# Patient Record
Sex: Female | Born: 1955
Health system: Southern US, Community
[De-identification: ages and names within clinical notes are randomized; demographics above are authoritative.]

## PROBLEM LIST (undated history)

## (undated) DIAGNOSIS — E785 Hyperlipidemia, unspecified: Secondary | ICD-10-CM

## (undated) DIAGNOSIS — K219 Gastro-esophageal reflux disease without esophagitis: Secondary | ICD-10-CM

## (undated) DIAGNOSIS — D869 Sarcoidosis, unspecified: Secondary | ICD-10-CM

## (undated) DIAGNOSIS — E039 Hypothyroidism, unspecified: Secondary | ICD-10-CM

## (undated) DIAGNOSIS — J069 Acute upper respiratory infection, unspecified: Secondary | ICD-10-CM

## (undated) DIAGNOSIS — D860 Sarcoidosis of lung: Secondary | ICD-10-CM

## (undated) DIAGNOSIS — I10 Essential (primary) hypertension: Secondary | ICD-10-CM

## (undated) HISTORY — DX: Acute upper respiratory infection, unspecified: J06.9

## (undated) HISTORY — PX: OTHER SURGICAL HISTORY: SHX169

## (undated) HISTORY — DX: Sarcoidosis of lung: D86.0

## (undated) HISTORY — DX: Hyperlipidemia, unspecified: E78.5

## (undated) HISTORY — DX: Sarcoidosis, unspecified: D86.9

## (undated) HISTORY — DX: Gastro-esophageal reflux disease without esophagitis: K21.9

## (undated) HISTORY — DX: Essential (primary) hypertension: I10

## (undated) HISTORY — PX: ABDOMINAL HYSTERECTOMY: SHX81

## (undated) HISTORY — PX: BUNIONECTOMY: SHX129

---

## 1998-12-20 ENCOUNTER — Other Ambulatory Visit: Admission: RE | Admit: 1998-12-20 | Discharge: 1998-12-20 | Payer: Self-pay | Admitting: Obstetrics and Gynecology

## 1999-12-18 ENCOUNTER — Encounter: Payer: Self-pay | Admitting: Emergency Medicine

## 1999-12-18 ENCOUNTER — Emergency Department (HOSPITAL_COMMUNITY): Admission: EM | Admit: 1999-12-18 | Discharge: 1999-12-18 | Payer: Self-pay | Admitting: Emergency Medicine

## 2000-04-08 ENCOUNTER — Other Ambulatory Visit: Admission: RE | Admit: 2000-04-08 | Discharge: 2000-04-08 | Payer: Self-pay | Admitting: Obstetrics and Gynecology

## 2000-08-26 ENCOUNTER — Encounter: Admission: RE | Admit: 2000-08-26 | Discharge: 2000-08-26 | Payer: Self-pay | Admitting: Family Medicine

## 2000-08-26 ENCOUNTER — Encounter: Payer: Self-pay | Admitting: Family Medicine

## 2001-04-03 ENCOUNTER — Other Ambulatory Visit: Admission: RE | Admit: 2001-04-03 | Discharge: 2001-04-03 | Payer: Self-pay | Admitting: Obstetrics and Gynecology

## 2001-08-27 ENCOUNTER — Encounter: Admission: RE | Admit: 2001-08-27 | Discharge: 2001-08-27 | Payer: Self-pay | Admitting: Family Medicine

## 2001-08-27 ENCOUNTER — Encounter: Payer: Self-pay | Admitting: Family Medicine

## 2001-12-31 ENCOUNTER — Encounter: Payer: Self-pay | Admitting: Family Medicine

## 2001-12-31 ENCOUNTER — Encounter: Admission: RE | Admit: 2001-12-31 | Discharge: 2001-12-31 | Payer: Self-pay | Admitting: Family Medicine

## 2002-05-24 ENCOUNTER — Other Ambulatory Visit: Admission: RE | Admit: 2002-05-24 | Discharge: 2002-05-24 | Payer: Self-pay | Admitting: Obstetrics and Gynecology

## 2002-08-30 ENCOUNTER — Encounter: Payer: Self-pay | Admitting: Obstetrics and Gynecology

## 2002-08-30 ENCOUNTER — Encounter: Admission: RE | Admit: 2002-08-30 | Discharge: 2002-08-30 | Payer: Self-pay | Admitting: Obstetrics and Gynecology

## 2003-07-30 ENCOUNTER — Encounter: Payer: Self-pay | Admitting: Unknown Physician Specialty

## 2003-07-30 ENCOUNTER — Ambulatory Visit (HOSPITAL_COMMUNITY): Admission: RE | Admit: 2003-07-30 | Discharge: 2003-07-30 | Payer: Self-pay | Admitting: Unknown Physician Specialty

## 2003-08-10 ENCOUNTER — Other Ambulatory Visit: Admission: RE | Admit: 2003-08-10 | Discharge: 2003-08-10 | Payer: Self-pay | Admitting: Obstetrics and Gynecology

## 2003-08-15 ENCOUNTER — Encounter: Payer: Self-pay | Admitting: Thoracic Surgery

## 2003-08-17 ENCOUNTER — Ambulatory Visit (HOSPITAL_COMMUNITY): Admission: RE | Admit: 2003-08-17 | Discharge: 2003-08-17 | Payer: Self-pay | Admitting: Thoracic Surgery

## 2003-08-17 ENCOUNTER — Encounter (INDEPENDENT_AMBULATORY_CARE_PROVIDER_SITE_OTHER): Payer: Self-pay | Admitting: Specialist

## 2003-11-11 ENCOUNTER — Encounter (INDEPENDENT_AMBULATORY_CARE_PROVIDER_SITE_OTHER): Payer: Self-pay | Admitting: *Deleted

## 2003-11-11 ENCOUNTER — Ambulatory Visit (HOSPITAL_COMMUNITY): Admission: RE | Admit: 2003-11-11 | Discharge: 2003-11-11 | Payer: Self-pay | Admitting: *Deleted

## 2003-11-21 ENCOUNTER — Ambulatory Visit (HOSPITAL_COMMUNITY): Admission: RE | Admit: 2003-11-21 | Discharge: 2003-11-21 | Payer: Self-pay

## 2003-12-02 ENCOUNTER — Encounter: Admission: RE | Admit: 2003-12-02 | Discharge: 2003-12-02 | Payer: Self-pay | Admitting: Family Medicine

## 2004-10-09 ENCOUNTER — Other Ambulatory Visit: Admission: RE | Admit: 2004-10-09 | Discharge: 2004-10-09 | Payer: Self-pay | Admitting: Obstetrics and Gynecology

## 2005-02-11 ENCOUNTER — Encounter: Admission: RE | Admit: 2005-02-11 | Discharge: 2005-02-11 | Payer: Self-pay | Admitting: Family Medicine

## 2005-04-09 ENCOUNTER — Ambulatory Visit: Payer: Self-pay | Admitting: Internal Medicine

## 2005-12-20 ENCOUNTER — Other Ambulatory Visit: Admission: RE | Admit: 2005-12-20 | Discharge: 2005-12-20 | Payer: Self-pay | Admitting: Obstetrics and Gynecology

## 2005-12-23 HISTORY — PX: COLONOSCOPY: SHX174

## 2006-09-25 ENCOUNTER — Ambulatory Visit (HOSPITAL_BASED_OUTPATIENT_CLINIC_OR_DEPARTMENT_OTHER): Admission: RE | Admit: 2006-09-25 | Discharge: 2006-09-25 | Payer: Self-pay | Admitting: Urology

## 2007-04-17 ENCOUNTER — Ambulatory Visit: Payer: Self-pay | Admitting: Oncology

## 2007-05-12 LAB — CBC WITH DIFFERENTIAL/PLATELET
Basophils Absolute: 0 10*3/uL (ref 0.0–0.1)
Eosinophils Absolute: 0.3 10*3/uL (ref 0.0–0.5)
HGB: 12.5 g/dL (ref 11.6–15.9)
LYMPH%: 20.7 % (ref 14.0–48.0)
MCV: 85.1 fL (ref 81.0–101.0)
MONO%: 9 % (ref 0.0–13.0)
NEUT#: 5.6 10*3/uL (ref 1.5–6.5)
Platelets: 348 10*3/uL (ref 145–400)

## 2007-05-14 LAB — BETA 2 MICROGLOBULIN, SERUM: Beta-2 Microglobulin: 1.38 mg/L (ref 1.01–1.73)

## 2007-05-14 LAB — COMPREHENSIVE METABOLIC PANEL
Albumin: 4.6 g/dL (ref 3.5–5.2)
Alkaline Phosphatase: 73 U/L (ref 39–117)
BUN: 11 mg/dL (ref 6–23)
Creatinine, Ser: 0.87 mg/dL (ref 0.40–1.20)
Glucose, Bld: 102 mg/dL — ABNORMAL HIGH (ref 70–99)
Total Bilirubin: 0.3 mg/dL (ref 0.3–1.2)

## 2007-05-14 LAB — IMMUNOFIXATION ELECTROPHORESIS
IgA: 196 mg/dL (ref 68–378)
IgM, Serum: 130 mg/dL (ref 60–263)
Total Protein, Serum Electrophoresis: 8.9 g/dL — ABNORMAL HIGH (ref 6.0–8.3)

## 2007-05-14 LAB — SEDIMENTATION RATE: Sed Rate: 34 mm/hr — ABNORMAL HIGH (ref 0–22)

## 2007-06-23 ENCOUNTER — Ambulatory Visit: Payer: Self-pay | Admitting: Internal Medicine

## 2008-06-21 ENCOUNTER — Ambulatory Visit: Payer: Self-pay | Admitting: Internal Medicine

## 2008-06-21 ENCOUNTER — Telehealth (INDEPENDENT_AMBULATORY_CARE_PROVIDER_SITE_OTHER): Payer: Self-pay | Admitting: *Deleted

## 2008-06-21 DIAGNOSIS — D869 Sarcoidosis, unspecified: Secondary | ICD-10-CM | POA: Insufficient documentation

## 2008-06-21 DIAGNOSIS — J069 Acute upper respiratory infection, unspecified: Secondary | ICD-10-CM | POA: Insufficient documentation

## 2008-06-28 DIAGNOSIS — K219 Gastro-esophageal reflux disease without esophagitis: Secondary | ICD-10-CM | POA: Insufficient documentation

## 2008-08-31 ENCOUNTER — Telehealth: Payer: Self-pay | Admitting: Internal Medicine

## 2008-09-01 ENCOUNTER — Ambulatory Visit: Payer: Self-pay | Admitting: Internal Medicine

## 2008-12-21 ENCOUNTER — Telehealth: Payer: Self-pay | Admitting: Internal Medicine

## 2008-12-30 ENCOUNTER — Encounter: Payer: Self-pay | Admitting: Internal Medicine

## 2008-12-30 ENCOUNTER — Telehealth: Payer: Self-pay | Admitting: Internal Medicine

## 2009-07-21 ENCOUNTER — Ambulatory Visit: Payer: Self-pay | Admitting: Internal Medicine

## 2009-07-21 DIAGNOSIS — I10 Essential (primary) hypertension: Secondary | ICD-10-CM | POA: Insufficient documentation

## 2009-08-04 ENCOUNTER — Telehealth: Payer: Self-pay | Admitting: Adult Health

## 2009-08-25 ENCOUNTER — Encounter: Admission: RE | Admit: 2009-08-25 | Discharge: 2009-08-25 | Payer: Self-pay | Admitting: Family Medicine

## 2009-09-05 ENCOUNTER — Encounter: Admission: RE | Admit: 2009-09-05 | Discharge: 2009-09-05 | Payer: Self-pay | Admitting: Family Medicine

## 2009-09-11 ENCOUNTER — Telehealth: Payer: Self-pay | Admitting: Internal Medicine

## 2009-09-20 ENCOUNTER — Ambulatory Visit: Payer: Self-pay | Admitting: Internal Medicine

## 2009-09-25 ENCOUNTER — Telehealth: Payer: Self-pay | Admitting: Internal Medicine

## 2010-05-08 ENCOUNTER — Telehealth: Payer: Self-pay | Admitting: Internal Medicine

## 2010-07-20 ENCOUNTER — Ambulatory Visit: Payer: Self-pay | Admitting: Internal Medicine

## 2010-08-07 ENCOUNTER — Ambulatory Visit: Payer: Self-pay | Admitting: Internal Medicine

## 2010-08-07 DIAGNOSIS — R0602 Shortness of breath: Secondary | ICD-10-CM | POA: Insufficient documentation

## 2010-08-07 LAB — PULMONARY FUNCTION TEST

## 2010-09-18 ENCOUNTER — Ambulatory Visit: Payer: Self-pay | Admitting: Internal Medicine

## 2010-09-18 DIAGNOSIS — E785 Hyperlipidemia, unspecified: Secondary | ICD-10-CM | POA: Insufficient documentation

## 2011-01-24 NOTE — Assessment & Plan Note (Signed)
Summary: F/U 2 MONTHS///KP   Primary Provider/Referring Provider:  Dr Cam Hai  CC:  2 month follow up visit-breathing is better since humidity is down.Marland Kitchen  History of Present Illness:  09/20/09- hx sarcoid, GERD CT showed marked lymhadenopathy, c/w her sarcoid. We brought her back to discuss that, The last ACE was 06/23/07- 28 (9-67). She feels fine.She notes occasional mild indgestion after eating. Joints sometimes minor stiff/ aching. Not coughing. CT reviewed by me and discussed with her- prominent cevical adenopathy c/w sarcoid.  July 20, 2010- Hx Sarcoid, GERD CXR 09/20/09- stable mild hilar adenopathy ACE level 09/20/09- 22 (9-67) Feels more breathless/ labored in current code orange air quality. Denies night sweats, fever, swollen glands or rash. Not coughing. She has more trouble dealing with hot weather and recognizes that affects her on her drive home, but she is not really descirbing dypnea otherwise.  September 18, 2010- Hx Sarcoid, GERD Feels well w/o night sweat, cough, chest pain, change in nodes or rash. Likes decreased humidity. CXR last year had shown persistent stable hilar nodes, NAD. PFT- 08/07/10 Mild restriction and obstruction. FEV1 1.58/ 68%; FEV1/FVC 0.79; DLCO 64; TLC 75% 100, 100, 100%, 463 m.  Preventive Screening-Counseling & Management  Alcohol-Tobacco     Smoking Status: never  Current Medications (verified): 1)  Levoxyl 50 Mcg  Tabs (Levothyroxine Sodium) .... Take 1 By Mouth Once Daily 2)  Premarin 1.25 Mg  Patch .... Twice Per Wk 3)  Crestor 40 Mg Tabs (Rosuvastatin Calcium) .... Take 1 By Mouth Once Daily 4)  Hydrochlorothiazide 12.5 Mg  Tabs (Hydrochlorothiazide) .... Take 1/2 By Mouth Once Daily 5)  Claritin 10 Mg Tabs (Loratadine) .... Take One Tablet Every Morning 6)  Garlic 2000 Mg Caps (Garlic) .... Take 1 By Mouth Once Daily 7)  Multivitamins  Tabs (Multiple Vitamin) .... Take 1 By Mouth Once Daily  Allergies (verified): 1)  !  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 Never smoked   Risk Factors: Smoking Status: never (09/18/2010)  Past Medical History: GERD (ICD-530.81) VIRAL URI (ICD-465.9) SARCOIDOSIS, PULMONARY (ICD-135)-                                           - Dx'd mediastinal node bx                                          - PFT 08/09/10- FEV1 1.58/ 68%; FEV1?FVC 0.79, TLC 75%, DLCO 0.64 Hypertension Hyperlipidemia  Review of Systems      See HPI  The patient denies shortness of breath with activity, shortness of breath at rest, productive cough, non-productive cough, coughing up blood, chest pain, irregular heartbeats, acid heartburn, indigestion, loss of appetite, weight change, abdominal pain, difficulty swallowing, sore throat, tooth/dental problems, headaches, nasal congestion/difficulty breathing through nose, sneezing, itching, ear ache, anxiety, rash, change in color of mucus, and fever.    Vital Signs:  Patient profile:   55 year old female Height:      62.5 inches Weight:  136.50 pounds BMI:     24.66 O2 Sat:      100 % on Room air Pulse rate:   94 / minute BP sitting:   124 / 66  (left arm) Cuff size:   regular  Vitals Entered By: Reynaldo Minium CMA (September 18, 2010 2:37 PM)  O2 Flow:  Room air CC: 2 month follow up visit-breathing is better since humidity is down.   Physical Exam  Additional Exam:  General: A/Ox3; pleasant and cooperative, NAD, wdwn. SKIN: hyperpigmented scar on sternum without nodularity- unchanged NODES: supraclavicular nodes nontender, rubbery HEENT: [place]/AT, EOM- WNL, Conjuctivae- clear, PERRLA, TM-WNL, Nose- clear, Throat- clear and wnl,  Mallampati  III NECK:  Fullness- probably enlarged cervical nodes Supple w/ fair ROM, JVD- none, normal carotid impulses w/o bruits Thyroid-  CHEST: Clear to P&A HEART: RRR, no m/g/r heard ABDOMEN: Soft and nl; ZOX:WRUE, nl pulses, no edema  NEURO: Grossly intact to observation      Impression & Recommendations:  Problem # 1:  SARCOIDOSIS, PULMONARY (ICD-135) Stable and probably burned out with residual adenopathy.  extensive lab review done. Her PFT changes are consistent with some mild interstitial changes from sarcoid, with no therapy needed. She does active cardio Zumba classes three times weekly and that is great. Her oxygenation is good.  We discussed pneumovax and I am comfortable to let it wait till age 36. Wants Flu vax  Medications Added to Medication List This Visit: 1)  Crestor 40 Mg Tabs (Rosuvastatin calcium) .... Take 1 by mouth once daily  Other Orders: Est. Patient Level IV (45409) Admin 1st Vaccine (81191) Flu Vaccine 105yrs + (47829)  Patient Instructions: 1)  cc Dr Cam Hai 2)  Please schedule a follow-up appointment as needed. 3)  Flu vax    Flu Vaccine Consent Questions     Do you have a history of severe allergic reactions to this vaccine? no    Any prior history of allergic reactions to egg and/or gelatin? no    Do you have a sensitivity to the preservative Thimersol? no    Do you have a past history of Guillan-Barre Syndrome? no    Do you currently have an acute febrile illness? no    Have you ever had a severe reaction to latex? no    Vaccine information given and explained to patient? yes    Are you currently pregnant? no    Lot Number:[id number]   Exp Date:06/22/2011   Site Given  Right Deltoid IMlu Abigail Miyamoto RN  September 18, 2010 3:28 PM

## 2011-01-24 NOTE — Assessment & Plan Note (Signed)
Summary: f/u 1 yr ///kp   Primary Provider/Referring Provider:  Dr Cam Hai  CC:  Yearly follow up visit-sarcoidosis.Marland Kitchen  History of Present Illness: History of Present Illness: 09/01/08- 55 year old woman with history of sarcoid and GERD.    Chest x-ray in July -2008  showed hilar adenopathy with normal ACE level on lab work.    September 01, 2008--Complains of 1 week of congestion, thick mucus, nasal drip, sinus pressure, chills. Denies chest pain, dyspnea, orthopnea, hemoptysis, fever, n/v/d, edema, recent travel, purulent sputum.   07/20/09- hx Sarcoid, GERD Feels well with no problems since last here for an acute visit last September. She denies cough, night sweats, fevers, glands, rash, chest pain.  09/20/09- hx sarcoid, GERD CT showed marked lymhadenopathy, c/w her sarcoid. We brought her back to discuss that, The last ACE was 06/23/07- 28 (9-67). She feels fine.She notes occasional mild indgestion after eating. Joints sometimes minor stiff/ aching. Not coughing. CT reviewed by me and discussed with her- prominent cevical adenopathy c/w sarcoid.  July 20, 2010- Hx Sarcoid, GERD CXR 09/20/09- stable mild hilar adenopathy ACE level 09/20/09- 22 (9-67) Feels more breathless/ labored in current code orange air quality. Denies night sweats, fever, swollen glands or rash. Not coughing. She has more trouble dealing with hot weather and recognizes that affects her on her drive home, but she is not really descirbing dypnea otherwise.    Preventive Screening-Counseling & Management  Alcohol-Tobacco     Smoking Status: never  Current Medications (verified): 1)  Levoxyl 50 Mcg  Tabs (Levothyroxine Sodium) .... Take 1 By Mouth Once Daily 2)  Premarin 1.25 Mg  Patch .... Twice Per Wk 3)  Crestor 10 Mg  Tabs (Rosuvastatin Calcium) .... Take 1 By Mouth Once Daily 4)  Hydrochlorothiazide 12.5 Mg  Tabs (Hydrochlorothiazide) .... Take 1/2 By Mouth Once Daily 5)  Claritin 10 Mg Tabs (Loratadine)  .... Take One Tablet Every Morning 6)  Nasacort Aq 55 Mcg/act Aers (Triamcinolone Acetonide(Nasal)) .... 2 Puffs Two Times A Day As Needed 7)  Garlic 2000 Mg Caps (Garlic) .... Take 1 By Mouth Once Daily 8)  Multivitamins  Tabs (Multiple Vitamin) .... Take 1 By Mouth Once Daily  Allergies (verified): 1)  ! 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  The patient denies anorexia, fever, weight loss, vision loss, decreased hearing, hoarseness, chest pain, syncope, dyspnea on exertion, peripheral edema, prolonged cough, headaches, hemoptysis, abdominal pain, and severe indigestion/heartburn.    Vital Signs:  Patient profile:   55 year old female Height:      62.5 inches Weight:      139 pounds BMI:     25.11 O2 Sat:      100 % on Room air Pulse rate:   72 / minute BP sitting:   140 / 76  (left arm) Cuff size:   regular  Vitals Entered By: Reynaldo Minium CMA (July 20, 2010  2:29 PM)  O2 Flow:  Room air CC: Yearly follow up visit-sarcoidosis.   Physical Exam  Additional Exam:  General: A/Ox3; pleasant and cooperative, NAD, wdwn. has gained some weight SKIN:hyperpigmented scar on sternum without nodularity- unchanged NODES: no lymphadenopathy HEENT: [place]/AT, EOM- WNL, Conjuctivae- clear, PERRLA, TM-WNL, Nose- clear, Throat- clear and wnl NECK:  Fullness- probably  enlarged cervical nodes Supple w/ fair ROM, JVD- none, normal carotid impulses w/o bruits Thyroid-  CHEST: Clear to P&A HEART: RRR, no m/g/r heard ABDOMEN: Soft and nl; EAV:WUJW, nl pulses, no edema  NEURO: Grossly intact to observation      Impression & Recommendations:  Problem # 1:  SARCOIDOSIS, PULMONARY (ICD-135) I find no sign of active sarcoid. She needs to be carefull in hot weather and pace herself accordingly. We discussed whether menopausal hot flashing was aggravating sensitivity to hot weather, but I suspect her weight gain is more significant. We will schedule a PFT.  Problem # 2:  GERD (ICD-530.81) She isn't aware that she has been refluxing lately. We re=viewed reflux precautions. The following medications were removed from the medication list:    Prilosec Otc 20 Mg Tbec (Omeprazole magnesium) .Marland Kitchen... 1 by mouth once daily  Medications Added to Medication List This Visit: 1)  Garlic 2000 Mg Caps (Garlic) .... Take 1 by mouth once daily 2)  Multivitamins Tabs (Multiple vitamin) .... Take 1 by mouth once daily  Other Orders: Est. Patient Level IV (11914)  Patient Instructions: 1)  Please schedule a follow-up appointment in 2 months 2)  Schedule PFT with 6 MWT

## 2011-01-24 NOTE — Progress Notes (Signed)
Summary: rx req/ sinus  Phone Note Call from Patient   Caller: Patient Call For: young Summary of Call: pt c/o sinus congestion "off and on for a month". has used nasal spray/ netty pot/ claritin D as well as musinex D. has a "skunk-like" taste in her mouth. denies fever.  says she's able to work fine, but [facility] [address] [phone] or home # if calling after 5pm. note: since she hasn't been seen in a while i did offer an appt w/ tp for tomorrow but pt prefers not to have to come in for now.  Initial call taken by: Tivis Ringer, CNA,  May 08, 2010 11:09 AM  Follow-up for Phone Call        [facility]. Carron Curie CMA  May 08, 2010 11:31 AM  Spoke with pt.  Pt states she has a dry cough and sinus congestion "on and off" x1 month.  Denies fever.  Has tried all the above meds.  [facility]ing [facility] [address] [facility].  Will forward to CY-pls advise.  Thanks!   Additional Follow-up for Phone Call Additional follow up Details #1::        Try Septra DS, # 14, 1 two times a day  Additional Follow-up by: Waymon Budge MD,  May 08, 2010 12:22 PM    Additional Follow-up for Phone Call Additional follow up Details #2::    rx sent. pt aware. Carron Curie CMA  May 08, 2010 1:16 PM   New/Updated Medications: SEPTRA DS 800-160 MG TABS (SULFAMETHOXAZOLE-TRIMETHOPRIM) Take 1 tablet by mouth two times a day Prescriptions: SEPTRA DS 800-160 MG TABS (SULFAMETHOXAZOLE-TRIMETHOPRIM) Take 1 tablet by mouth two times a day  #14 x 0   Entered by:   Carron Curie CMA   Authorized by:   Waymon Budge MD   Signed by:   Carron Curie CMA on 05/08/2010   Method used:   [facility] to        [facility]  [address] [id number]       [address]       [place], [place]  [address]       Ph: [phone] or [phone]       Fax: [phone]   RxID:   [id number]

## 2011-01-24 NOTE — Assessment & Plan Note (Signed)
Summary: SIX MIN WALK- PULM STRESS TEST  Nurse Visit   Vital Signs:  Patient profile:   55 year old female Pulse rate:   86 / minute BP sitting:   130 / 76  Medications Prior to Update: 1)  Levoxyl 50 Mcg  Tabs (Levothyroxine Sodium) .... Take 1 By Mouth Once Daily 2)  Premarin 1.25 Mg  Patch .... Twice Per Wk 3)  Crestor 10 Mg  Tabs (Rosuvastatin Calcium) .... Take 1 By Mouth Once Daily 4)  Hydrochlorothiazide 12.5 Mg  Tabs (Hydrochlorothiazide) .... Take 1/2 By Mouth Once Daily 5)  Claritin 10 Mg Tabs (Loratadine) .... Take One Tablet Every Morning 6)  Nasacort Aq 55 Mcg/act Aers (Triamcinolone Acetonide(Nasal)) .... 2 Puffs Two Times A Day As Needed 7)  Garlic 2000 Mg Caps (Garlic) .... Take 1 By Mouth Once Daily 8)  Multivitamins  Tabs (Multiple Vitamin) .... Take 1 By Mouth Once Daily  Allergies: 1)  ! Pcn  Orders Added: 1)  Pulmonary Stress (6 min walk) [94620]   Six Minute Walk Test Medications taken before test(dose and time): 1)  Levoxyl 50 Mcg  Tabs (Levothyroxine Sodium) .... Take 1 By Mouth Once Daily 4)  Hydrochlorothiazide 12.5 Mg  Tabs (Hydrochlorothiazide) .... Take 1/2 By Mouth Once Daily 5)  Claritin 10 Mg Tabs (Loratadine) .... Take One Tablet Every Morning-pt took 1/2 today  Supplemental oxygen during the test: No  Lap counter(place a tick mark inside a square for each lap completed) lap 1 complete  lap 2 complete   lap 3 complete   lap 4 complete  lap 5 complete  lap 6 complete  lap 7 complete   lap 8 complete   lap 9 complete   Baseline  BP sitting: 130/ 76 Heart rate: 86 Dyspnea ( Borg scale) 1 Fatigue (Borg scale) 0 SPO2 100  End Of Test  BP sitting: 134/ 78 Heart rate: 106 Dyspnea ( Borg scale) 2 Fatigue (Borg scale) 2 SPO2 100  2 Minutes post  BP sitting: 130/ 74 Heart rate: 84 SPO2 100  Stopped or paused before six minutes? No  Interpretation: Number of laps  9 X 48 meters =   463 meters+ final partial lap: 31 meters =     463 meters   Total distance walked in six minutes: 463 meters  Tech ID: Tivis Ringer, CNA (August 07, 2010 2:59 PM) Tech Comments Pt completed test w/ 0 rest breaks and 0 complaints.

## 2011-01-24 NOTE — Miscellaneous (Signed)
Summary: Orders Update pft charges  Clinical Lists Changes  Orders: Added new Service order of Carbon Monoxide diffusing w/capacity (94720) - Signed Added new Service order of Lung Volumes (94240) - Signed Added new Service order of Spirometry (Pre & Post) (94060) - Signed 

## 2011-05-07 NOTE — Assessment & Plan Note (Signed)
[facility]                             PULMONARY OFFICE NOTE   NAME:STEPHENSOliviana, [name]                  MRN:          [id number]  DATE:06/23/2007                            DOB:          1956/01/02    PROBLEM:  1. Sarcoidosis by mediastinal node biopsy.  2. Esophageal reflux.   HISTORY OF PRESENT ILLNESS:  This patient with previously identified  sarcoid, returned because she wanted a check of her status. She had had  recent thyroid and hormone levels checked without problem identified and  has had no cough, fever, adenopathy, or chest pain. She attributes mild  occasional night sweats to her hormones.   MEDICATIONS:  Crestor 10 mg, Estrogen/hormones, Pantoprazole,  Levothyroxine 50 mcg, Zyrtec.   ALLERGIES:  PENICILLIN.   PHYSICAL EXAMINATION:  VITAL SIGNS:  Weight 128 pounds, blood pressure  144/80, pulse 89. Room air saturation 100%.  GENERAL:  She seemed alert and comfortable. No obvious rash, no  adenopathy.  LUNGS:  Fields were clear.  HEART:  Sounds regular without murmur. No enlargement of liver or  spleen.   IMPRESSION:  History of sarcoid, clinically inactive.   PLAN:  Chest x-ray. She will call for report. She thinks an angiotensin  converting enzyme level was drawn as part of her blood work but we  cannot find it in the computer so she agrees to have that repeated. She  will return p.r.n.     Clinton D. Maple Hudson, MD, Tonny Bollman, FACP  Electronically Signed    CDY/MedQ  DD: 06/28/2007  DT: 06/28/2007  Job #: [id number]   cc:   Donia Guiles, M.D.

## 2011-05-10 NOTE — Op Note (Signed)
[name], Maria         ACCOUNT NO.:  [id number]   MEDICAL RECORD NO.:  [id number]          PATIENT TYPE:  AMB   LOCATION:  [facility]                         FACILITY:  [facility]   PHYSICIAN:  Sigmund I. Patsi Sears, M.D.DATE OF BIRTH:  Aug 24, 1956   DATE OF PROCEDURE:  09/25/2006  DATE OF DISCHARGE:                                 OPERATIVE REPORT   PREOPERATIVE DIAGNOSIS:  Urinary incontinence.   POSTOPERATIVE DIAGNOSIS:  Urinary incontinence.   OPERATIONS:  [facility] transvaginal retropubic pubovaginal  sling.   SURGEON:  Sigmund I. Patsi Sears, M.D.   ANESTHESIA:  General LMA.   PREPARATION:  After appropriate preanesthesia, the patient is brought to the  operating room and placed on the operating room table in the dorsal supine  position where general LMA anesthesia was introduced.  She was then replaced  in dorsal lithotomy position where the pubis was prepped with Betadine  solution and draped in the usual fashion.   REVIEW OF HISTORY:  This 55 year old African American female has a history  of cough/sneeze incontinence, is para 2-0-2-0.  Her examination has shown a  positive Marshall test and a positive Q-tip test, the patient is now for  transvaginal retropubic pubovaginal sling.   PROCEDURE:  With the patient in the dorsal lithotomy position, a Foley  catheter was placed in the bladder, and a sponge was placed in the posterior  vagina.  A short posterior weighted speculum was then placed.  A T-clamp was  then used to identify the urethra and the mid portion of the urethra was  injected with 0.5 plain Marcaine.  A Foley balloon was palpable at the level  of the bladder neck.   A 1.5 cm midline incision was made, subcutaneous tissue dissected, and  tissue dissected on either side of the urethra, to the level of the  pubocervical fascial arch.  Two separate incisions were then made  suprapubically, with the tip of a 10 blade, and the [place] retropubic  suspension needles placed retropubically.  Cystoscopy revealed a right sided  [place] needle in the bladder, this was replaced, and repeat cystoscopy  revealed excellent placement retropubically.  The [place] was then placed in  standard fashion, with a right angle clamp behind the urethra to protect the  urethra and keep the sling from being too snug.  The sleeves were removed  from the sling and repeat cystoscopy was accomplished, which showed no sling  within the bladder.   With the right angle behind the urethra in the midline, the wings of the  sling were then cut subcutaneously, and the vaginal incision closed with  inverted 2-0 Vicryl buried running suture.  Irrigation was accomplished.  The Foley catheter remained in place and vaginal packing was placed with  Estrace cream.  The patient was awakened and taken to the recovery room in  good condition.      Sigmund I. Patsi Sears, M.D.  Electronically Signed     SIT/MEDQ  D:  09/25/2006  T:  09/26/2006  Job:  [id number]

## 2011-05-10 NOTE — Op Note (Signed)
   NAMECHRISTALYNN, Maria [name]                     ACCOUNT NO.:  [id number]   MEDICAL RECORD NO.:  [id number]                   PATIENT TYPE:  OIB   LOCATION:  2899                                 FACILITY:  [facility]   PHYSICIAN:  Ines Bloomer, M.D.              DATE OF BIRTH:  1956-11-19   DATE OF PROCEDURE:  08/17/2003  DATE OF DISCHARGE:  08/17/2003                                 OPERATIVE REPORT   PREOPERATIVE DIAGNOSIS:  Mediastinal and left hilar adenopathy.   POSTOPERATIVE DIAGNOSES:  1. Mediastinal and left hilar adenopathy.  2. Probable sarcoid.   PROCEDURES:  1. Fiberoptic bronchoscopy.  2. Mediastinoscopy.   DESCRIPTION OF PROCEDURE:  After general anesthesia, the fiberoptic  bronchoscope was passed through the endotracheal tube.  The carina was in  the midline.  The right upper lobe, right middle lobe, and right lower lobe  orifices were normal.  There was some evidence of some bronchitis.  The left  mainstem, left upper lobe, and left lower lobe orifices were also normal.  Washings and cultures were taken.  The video bronchoscope was removed.  The  anterior neck was prepped and draped in the usual sterile manner.  A  transverse incision was made and carried down with electrocautery through  the subcutaneous tissue and fascia.  The pretracheal fascia was entered and  biopsies of 2R, 2L, 4L, and 4R nodes were done.  Frozen section reveals  noncaseating granulomatous disease consistent with probable sarcoid.  The  strap muscles closed with 2-0 Vicryl, subcutaneous tissue with 3-0 Vicryl,  and subcuticular with 3-0 Vicryl.  Dermabond for the skin.  A dry and  sterile dressing was applied.  The patient tolerated the procedure well, was  returned to the recovery room in stable condition.                                               Ines Bloomer, M.D.    DPB/MEDQ  D:  08/17/2003  T:  08/18/2003  Job:  [id number]

## 2011-05-13 ENCOUNTER — Telehealth: Payer: Self-pay | Admitting: Internal Medicine

## 2011-05-13 ENCOUNTER — Ambulatory Visit (INDEPENDENT_AMBULATORY_CARE_PROVIDER_SITE_OTHER): Payer: Self-pay | Admitting: Adult Health

## 2011-05-13 ENCOUNTER — Encounter: Payer: Self-pay | Admitting: Adult Health

## 2011-05-13 VITALS — BP 106/70 | HR 83 | Temp 99.3°F | Ht 62.5 in | Wt 137.2 lb

## 2011-05-13 DIAGNOSIS — J069 Acute upper respiratory infection, unspecified: Secondary | ICD-10-CM | POA: Insufficient documentation

## 2011-05-13 MED ORDER — HYDROCODONE-HOMATROPINE 5-1.5 MG/5ML PO SYRP: 5.0000 mL | ORAL_SOLUTION | Freq: Four times a day (QID) | ORAL | Status: AC | PRN

## 2011-05-13 MED ORDER — AZITHROMYCIN 250 MG PO TABS: 250.0000 mg | ORAL_TABLET | Freq: Every day | ORAL | Status: AC

## 2011-05-13 NOTE — Assessment & Plan Note (Signed)
Zpack take as directed.  Mucinex DM Twice daily  As needed  Cough/congestion Saline nasal rinses As needed   Hydromet 1 tsp every 6 hr As needed  Cough, may make you sleepy.  Please contact office for sooner follow up if symptoms do not improve or worsen or seek emergency care  follow up Dr. Young  As planned and As needed   

## 2011-05-13 NOTE — Telephone Encounter (Signed)
Pt aware and appt set for today at 2:45pm. Carron Curie, CMA

## 2011-05-13 NOTE — Telephone Encounter (Signed)
Spoke w/ pt and she c/o scratchy throat, dry cough, feels congested x Tuesday. Pt has been taking mucinex but has had no relief. Pt is wanting something called into pharmacy since their is no available openings. Pt was last seen 09/18/10 and told to follow up as needed. Please advise Dr. Maple Hudson. Thanks  Allergies  Allergen Reactions  . Penicillins     REACTION: rash    Carver Fila, CMA

## 2011-05-13 NOTE — Progress Notes (Signed)
Subjective:    Patient ID: Maria [name], female    DOB: Jul 13, 1956, 55 y.o.   MRN: [id number]  HPI 09/20/09- hx sarcoid, GERD  CT showed marked lymhadenopathy, c/w her sarcoid. We brought her back to discuss that, The last ACE was 06/23/07- 28 (9-67). She feels fine.She notes occasional mild indgestion after eating. Joints sometimes minor stiff/ aching. Not coughing.  CT reviewed by me and discussed with her- prominent cevical adenopathy c/w sarcoid.   July 20, 2010- Hx Sarcoid, GERD  CXR 09/20/09- stable mild hilar adenopathy  ACE level 09/20/09- 22 (9-67)  Feels more breathless/ labored in current code orange air quality. Denies night sweats, fever, swollen glands or rash. Not coughing. She has more trouble dealing with hot weather and recognizes that affects her on her drive home, but she is not really descirbing dypnea otherwise.   September 18, 2010- Hx Sarcoid, GERD  Feels well w/o night sweat, cough, chest pain, change in nodes or rash. Likes decreased humidity.  CXR last year had shown persistent stable hilar nodes, NAD.  PFT- 08/07/10 Mild restriction and obstruction. FEV1 1.58/ 68%; FEV1/FVC 0.79; DLCO 64; TLC 75%  100, 100, 100%, 463 m.   05/13/11 Acute OV Presents for an acute office visit. Complains of  chest congestion, dry cough, wheezing, f/c/s x6days . OTC  Are not helping. Cough is mainly dry. Has a lot of drainage in throat. Feels feverish. No chest pain or dyspnea.  Cough is keeping her up at night.   Feels worn out from coughing. NO energy.   Appetite is okay.   Review of Systems Constitutional:   No  weight loss, night sweats,  Fevers, chills,  +fatigue, or  lassitude.  HEENT:   No headaches,  Difficulty swallowing,  Tooth/dental problems, or  Sore throat,                + sneezing, itching, nasal congestion, post nasal drip,   CV:  No chest pain,  Orthopnea, PND, swelling in lower extremities, anasarca, dizziness, palpitations, syncope.   GI  No  heartburn, indigestion, abdominal pain, nausea, vomiting, diarrhea, change in bowel habits, loss of appetite, bloody stools.   Resp: No shortness of breath with exertion or at rest.   No coughing up of blood.  No change in color of mucus.  No wheezing.  No chest wall deformity  Skin: no rash or lesions.  GU: no dysuria, change in color of urine, no urgency or frequency.  No flank pain, no hematuria   MS:  No joint pain or swelling.  No decreased range of motion.     Psych:  No change in mood or affect. No depression or anxiety.  No memory loss.         Objective:   Physical Exam GEN: A/Ox3; pleasant , NAD, well nourished   HEENT:  [place]/AT,  EACs-clear, TMs-wnl, NOSE-clear drainage , THROAT-clear, no lesions, no postnasal drip or exudate noted.   NECK:  Supple w/ fair ROM; no JVD; normal carotid impulses w/o bruits; no thyromegaly or nodules palpated; no lymphadenopathy.  RESP  Clear  P & A; w/o, wheezes/ rales/ or rhonchi.no accessory muscle use, no dullness to percussion  CARD:  RRR, no m/r/g  , no peripheral edema, pulses intact, no cyanosis or clubbing.  GI:   Soft & nt; nml bowel sounds; no organomegaly or masses detected.  Musco: Warm bil, no deformities or joint swelling noted.   Neuro: alert, no focal deficits noted.  Skin: Warm, no lesions or rashes         Assessment & Plan:

## 2011-05-13 NOTE — Patient Instructions (Signed)
Zpack take as directed.  Mucinex DM Twice daily  As needed  Cough/congestion Saline nasal rinses As needed   Hydromet 1 tsp every 6 hr As needed  Cough, may make you sleepy.  Please contact office for sooner follow up if symptoms do not improve or worsen or seek emergency care  follow up Dr. Maple Hudson  As planned and As needed

## 2011-05-13 NOTE — Telephone Encounter (Signed)
Per CY-ok to see Maria [name] this afternoon.

## 2011-08-01 ENCOUNTER — Encounter: Payer: Self-pay | Admitting: Internal Medicine

## 2011-09-02 ENCOUNTER — Ambulatory Visit (INDEPENDENT_AMBULATORY_CARE_PROVIDER_SITE_OTHER): Payer: 59 | Admitting: Internal Medicine

## 2011-09-02 ENCOUNTER — Encounter: Payer: Self-pay | Admitting: Internal Medicine

## 2011-09-02 VITALS — BP 138/82 | HR 84 | Ht 62.5 in | Wt 137.0 lb

## 2011-09-02 DIAGNOSIS — K219 Gastro-esophageal reflux disease without esophagitis: Secondary | ICD-10-CM

## 2011-09-02 DIAGNOSIS — Z1211 Encounter for screening for malignant neoplasm of colon: Secondary | ICD-10-CM

## 2011-09-02 DIAGNOSIS — R141 Gas pain: Secondary | ICD-10-CM

## 2011-09-02 DIAGNOSIS — R142 Eructation: Secondary | ICD-10-CM

## 2011-09-02 NOTE — Patient Instructions (Signed)
Continue Aciphex daily

## 2011-09-02 NOTE — Progress Notes (Signed)
HISTORY OF PRESENT ILLNESS:  Maria [name] is a 55 y.o. female with the below list of medical history who presents today as a self referred patient to establish GI care. She has been seen previously by another GI group here in town. Her issue today is that of "acid reflux". She tells me that she has been on AcipHex for about 10 years. She states this helps with bloating. She denies heartburn. She cannot specify, but states she feels worse off medication. She has been on other PPIs but reports that they did not work. She denies dysphagia, abdominal pain, GI bleeding, or unexplained weight loss. Review of outside records shows an upper endoscopy performed 11/11/2003. The examination revealed a small hiatal hernia, but was otherwise normal. Biopsies around the mucosal Z line revealed gastric cardia type mucosa. The patient denies lower GI complaints. She did undergo screening colonoscopy in May of 2007. This was reported to be normal with followup in 10 years recommended. There is no family history of colon cancer. She inquires about potential side effects of PPIs. She does describe chronic problems with "a bad taste" in her mouth. For this, she may need a lozenge or mouthwash  REVIEW OF SYSTEMS:  All non-GI ROS negative.  Past Medical History  Diagnosis Date  . GERD (gastroesophageal reflux disease)   . Viral URI   . Pulmonary sarcoidosis   . Hypertension   . Hyperlipidemia   . Sarcoidosis     Past Surgical History  Procedure Date  . Mediastinal node biopsy     dx with sarcoid  . Abdominal hysterectomy     Social History Maria [name]  reports that she has never smoked. She has never used smokeless tobacco. She reports that she does not drink alcohol or use illicit drugs.  family history includes Breast cancer in her sister and Heart disease in her father and mother.  Allergies  Allergen Reactions  . Penicillins     REACTION: rash       PHYSICAL EXAMINATION: Vital  signs: BP 138/82  Pulse 84  Ht 5' 2.5" (1.588 m)  Wt 137 lb (62.143 kg)  BMI 24.66 kg/m2 General: Well-developed, well-nourished, no acute distress HEENT: Sclerae are anicteric, conjunctiva pink. Oral mucosa intact Lungs: Clear Heart: Regular Abdomen: soft, nontender, nondistended, no obvious ascites, no peritoneal signs, normal bowel sounds. No organomegaly. Extremities: No edema Psychiatric: alert and oriented x3. Cooperative    ASSESSMENT:  #1. Possible GERD. Negative prior EGD #2. Gas and bloating #3. Colon cancer screening. Up-to-date.   PLAN:  #1. We discussed the potential side effects of long term PPI use including bone density issues. She would need to weigh the benefits, and terms of how she feels, a PPI use versus non-use or other agents such as H2 receptor antagonist. I did tell her that PPIs or not generally used for bloating. #2. Surveillance colonoscopy around 2017 unless otherwise clinically indicated. We will make to recall for her. Interval GI followup as needed

## 2011-09-16 ENCOUNTER — Ambulatory Visit (INDEPENDENT_AMBULATORY_CARE_PROVIDER_SITE_OTHER): Payer: 59

## 2011-09-16 DIAGNOSIS — Z23 Encounter for immunization: Secondary | ICD-10-CM

## 2012-12-28 ENCOUNTER — Telehealth: Payer: Self-pay | Admitting: Internal Medicine

## 2012-12-28 NOTE — Telephone Encounter (Signed)
lmomtcb x1 TP last saw PT 04/2011 CDY last saw PT 9.27.2011

## 2012-12-28 NOTE — Telephone Encounter (Signed)
Pt scheduled to see CY tomorrow at 9:15. Advised to go to er if symptoms change or get worse. Carron Curie, CMA

## 2012-12-29 ENCOUNTER — Ambulatory Visit (INDEPENDENT_AMBULATORY_CARE_PROVIDER_SITE_OTHER): Payer: 59 | Admitting: Internal Medicine

## 2012-12-29 ENCOUNTER — Encounter: Payer: Self-pay | Admitting: Internal Medicine

## 2012-12-29 VITALS — BP 138/86 | HR 93 | Ht 62.5 in | Wt 140.2 lb

## 2012-12-29 DIAGNOSIS — D869 Sarcoidosis, unspecified: Secondary | ICD-10-CM

## 2012-12-29 DIAGNOSIS — J069 Acute upper respiratory infection, unspecified: Secondary | ICD-10-CM

## 2012-12-29 MED ORDER — METHYLPREDNISOLONE ACETATE 80 MG/ML IJ SUSP
80.0000 mg | Freq: Once | INTRAMUSCULAR | Status: AC
  Administered 2012-12-29: 80 mg via INTRAMUSCULAR

## 2012-12-29 MED ORDER — AZITHROMYCIN 250 MG PO TABS: ORAL_TABLET | ORAL | Status: AC

## 2012-12-29 MED ORDER — PHENYLEPHRINE HCL 1 % NA SOLN
3.0000 [drp] | Freq: Once | NASAL | Status: AC
  Administered 2012-12-29: 3 [drp] via NASAL

## 2012-12-29 NOTE — Patient Instructions (Addendum)
Script sent for Z pak  Fluids, mucinex, Neti pot and comfort meds may help  Neb neo nasal  Depo 80

## 2012-12-29 NOTE — Progress Notes (Signed)
Subjective:    Patient ID: Maria [name], female    DOB: [birth year], 57 y.o.   MRN: [id number]  HPI 09/20/09- hx sarcoid, GERD  CT showed marked lymhadenopathy, c/w her sarcoid. We brought her back to discuss that, The last ACE was 06/23/07- 28 (9-67). She feels fine.She notes occasional mild indgestion after eating. Joints sometimes minor stiff/ aching. Not coughing.  CT reviewed by me and discussed with her- prominent cevical adenopathy c/w sarcoid.   July 20, 2010- Hx Sarcoid, GERD  CXR 09/20/09- stable mild hilar adenopathy  ACE level 09/20/09- 22 (9-67)  Feels more breathless/ labored in current code orange air quality. Denies night sweats, fever, swollen glands or rash. Not coughing. She has more trouble dealing with hot weather and recognizes that affects her on her drive home, but she is not really descirbing dypnea otherwise.   September 18, 2010- Hx Sarcoid, GERD  Feels well w/o night sweat, cough, chest pain, change in nodes or rash. Likes decreased humidity.  CXR last year had shown persistent stable hilar nodes, NAD.  PFT- 08/07/10 Mild restriction and obstruction. FEV1 1.58/ 68%; FEV1/FVC 0.79; DLCO 64; TLC 75%  100, 100, 100%, 463 m.   05/13/11 Acute OV Presents for an acute office visit. Complains of  chest congestion, dry cough, wheezing, f/c/s x6days . OTC  Are not helping. Cough is mainly dry. Has a lot of drainage in throat. Feels feverish. No chest pain or dyspnea.  Cough is keeping her up at night.  Feels worn out from coughing. NO energy.  Appetite is okay.   12/29/12- 57 yoF never smoker followed for Hx Sarcoid, GERD  FOLLOWS FOR: last seen 08-2010 by CY; seen 04-2011 by TP. Cough, pink/red color to right eye, wheezing but denies any SOB. History of sarcoid in long-term remission but with chronic cervical adenopathy and mild restriction on PFTs 08/07/2010. Acute illness with 4 days of fever, pinkeye, cough, sore throat, scant green sputum, transient aching. Has  used Neti pot. She did have flu shot. 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 CXR 09/20/09 IMPRESSION:  Stable mild hilar adenopathy. No active lung disease.  Provider: Bud Face  ROS-see HPI Constitutional:   No-   weight loss, night sweats, fevers, chills, fatigue, lassitude. HEENT:   No-  headaches, difficulty swallowing, tooth/dental problems, sore throat,       No-  sneezing, itching, ear ache, nasal congestion, post nasal drip,  CV:  No-   chest pain, orthopnea, PND, swelling in lower extremities, anasarca,                                  dizziness, palpitations Resp: No-   shortness of breath with exertion or at rest.              No-   productive cough,  No non-productive cough,  No- coughing up of blood.              No-   change in color of mucus.  No- wheezing.   Skin: No-   rash or lesions. GI:  No-   heartburn, indigestion, abdominal pain, nausea, vomiting,  GU:  MS:  No-   joint pain or swelling.   Neuro-     nothing unusual Psych:  No- change in mood or affect. No depression or anxiety.  No memory loss.  Objective:  OBJ-  Physical Exam General- Alert, Oriented, Affect-appropriate, Distress- none acute Skin- rash-none, lesions- none, excoriation- none Lymphadenopathy + chronic bilateral cervical adenopathy, nontender Head- atraumatic            Eyes- Gross vision intact, PERRLA, conjunctivae and secretions clear/ watery, not injected            Ears- Hearing, canals-normal            Nose- Clear, no-Septal dev, mucus, polyps, erosion, perforation             Throat- Mallampati IV , mucosa  not read , drainage- none, tonsils- atrophic Neck- flexible , trachea midline, no stridor , thyroid nl, carotid no bruit Chest - symmetrical excursion , unlabored           Heart/CV- RRR , no murmur , no gallop  , no rub, nl s1 s2                           - JVD- none , edema- none, stasis changes- none, varices- none            Lung- clear to P&A, wheeze- none, + dry cough , dullness-none, rub- none           Chest wall-  Abd- tender-no, distended-no, bowel sounds-present, HSM- no Br/ Gen/ Rectal- Not done, not indicated Extrem- cyanosis- none, clubbing, none, atrophy- none, strength- nl Neuro- grossly intact to observation Assessment & Plan:

## 2013-01-09 NOTE — Assessment & Plan Note (Signed)
This is consistent with a viral syndrome. We talked about lack of available intervention. With the weekend coming and uncertain future course, I agreed to let her hold a prescription for Z-Pak to use if this becomes purulent. Otherwise conservative symptomatic therapy as outlined.

## 2013-01-09 NOTE — Assessment & Plan Note (Signed)
Plan-we have reviewed symptoms that would suggest reactivation. That becomes less likely with time.

## 2013-01-21 ENCOUNTER — Encounter: Payer: Self-pay | Admitting: Internal Medicine

## 2013-04-29 ENCOUNTER — Telehealth: Payer: Self-pay | Admitting: Internal Medicine

## 2013-04-29 NOTE — Telephone Encounter (Signed)
LMOM x 1 

## 2013-04-29 NOTE — Telephone Encounter (Signed)
Pt c/o increased allergy symptoms. Pt using Netti Pot and Claritin with NO RELIEF of symptoms. Pt c/o itchy scratchy throat with sneezing x 3 weeks. Pt denies any other symptoms//denies fever/denies change in color of mucus. Pt wanting recs on anything she may use OTC or Rx to help relieve symptoms.   [facility] [address] [place] Allergies  Allergen Reactions  . Penicillins     REACTION: rash   Please advise Dr Shelle Iron. Thanks.

## 2013-04-29 NOTE — Telephone Encounter (Signed)
Spoke with pt verbally understood CY Rec's  Nothing further needed.

## 2013-04-29 NOTE — Telephone Encounter (Signed)
Per CY-try Allegra D 12 hours at pharmacy counter 1 twice daily if needed.

## 2014-05-23 ENCOUNTER — Telehealth: Payer: Self-pay | Admitting: Internal Medicine

## 2014-05-23 NOTE — Telephone Encounter (Signed)
Pt called in wanting to be seen by Dr. Marina Goodell for abd pain and a temp.  Told her that his next avail was on 07-05-14 and we could place on a waiting list.  Pt stated she could not wait that long.  Offered to triage the call and told her that the nurse could possibly get her in with a PA.  Pt declined, said she would go see her PCP

## 2014-10-31 ENCOUNTER — Telehealth: Payer: Self-pay | Admitting: Internal Medicine

## 2014-10-31 NOTE — Telephone Encounter (Signed)
Called pt and appt scheduled to see TP tomorrow at 4:15. nothing further needed

## 2014-10-31 NOTE — Telephone Encounter (Signed)
Unless Maria AddisonKatie has an opening, suggest you ask TP if she has opening.

## 2014-10-31 NOTE — Telephone Encounter (Signed)
Spoke with the pt  She is c/o increased SOB for the past few days  She states that she gets tired more than usual  Requests appt with CDY  She was last seen 12/29/12  No appt pending  Allergies  Allergen Reactions  . Penicillins     REACTION: rash   Current Outpatient Prescriptions on File Prior to Visit  Medication Sig Dispense Refill  . hydrochlorothiazide (HYDRODIURIL) 25 MG tablet Take 12.5 mg by mouth daily.    Marland Kitchen. levothyroxine (SYNTHROID, LEVOTHROID) 50 MCG tablet Take 50 mcg by mouth daily.      Marland Kitchen. loratadine (CLARITIN) 10 MG tablet Take 10 mg by mouth daily.      . pantoprazole (PROTONIX) 40 MG tablet Take 40 mg by mouth daily.    . rosuvastatin (CRESTOR) 40 MG tablet Take 20 mg by mouth daily.      No current facility-administered medications on file prior to visit.

## 2014-11-01 ENCOUNTER — Ambulatory Visit (INDEPENDENT_AMBULATORY_CARE_PROVIDER_SITE_OTHER): Payer: 59 | Admitting: Adult Health

## 2014-11-01 ENCOUNTER — Ambulatory Visit (INDEPENDENT_AMBULATORY_CARE_PROVIDER_SITE_OTHER)
Admission: RE | Admit: 2014-11-01 | Discharge: 2014-11-01 | Disposition: A | Discharge status: Home / Self Care | Payer: 59 | Source: Ambulatory Visit | Attending: Adult Health | Admitting: Adult Health

## 2014-11-01 ENCOUNTER — Encounter: Payer: Self-pay | Admitting: Adult Health

## 2014-11-01 VITALS — BP 122/74 | HR 79 | Temp 97.1°F | Ht 62.5 in | Wt 140.0 lb

## 2014-11-01 DIAGNOSIS — D869 Sarcoidosis, unspecified: Secondary | ICD-10-CM

## 2014-11-01 DIAGNOSIS — K219 Gastro-esophageal reflux disease without esophagitis: Secondary | ICD-10-CM

## 2014-11-01 NOTE — Progress Notes (Signed)
Subjective:    Patient ID: Maria [name], female    DOB: 07-12-1956, 58 y.o.   MRN: [id number]  HPI 09/20/09- hx sarcoid, GERD  CT showed marked lymhadenopathy, c/w her sarcoid. We brought her back to discuss that, The last ACE was 06/23/07- 28 (9-67). She feels fine.She notes occasional mild indgestion after eating. Joints sometimes minor stiff/ aching. Not coughing.  CT reviewed by me and discussed with her- prominent cevical adenopathy c/w sarcoid.   July 20, 2010- Hx Sarcoid, GERD  CXR 09/20/09- stable mild hilar adenopathy  ACE level 09/20/09- 22 (9-67)  Feels more breathless/ labored in current code orange air quality. Denies night sweats, fever, swollen glands or rash. Not coughing. She has more trouble dealing with hot weather and recognizes that affects her on her drive home, but she is not really descirbing dypnea otherwise.   September 18, 2010- Hx Sarcoid, GERD  Feels well w/o night sweat, cough, chest pain, change in nodes or rash. Likes decreased humidity.  CXR last year had shown persistent stable hilar nodes, NAD.  PFT- 08/07/10 Mild restriction and obstruction. FEV1 1.58/ 68%; FEV1/FVC 0.79; DLCO 64; TLC 75%  6MWT 100, 100, 100%, 463 m.   05/13/11 Acute OV Presents for an acute office visit. Complains of  chest congestion, dry cough, wheezing, f/c/s x6days . OTC  Are not helping. Cough is mainly dry. Has a lot of drainage in throat. Feels feverish. No chest pain or dyspnea.  Cough is keeping her up at night.  Feels worn out from coughing. NO energy.  Appetite is okay.   12/29/12- [age] yoF never smoker followed for Hx Sarcoid, GERD  FOLLOWS FOR: last seen 08-2010 by CY; seen 04-2011 by TP. Cough, pink/red color to right eye, wheezing but denies any SOB. History of sarcoid in long-term remission but with chronic cervical adenopathy and mild restriction on PFTs 08/07/2010. Acute illness with 4 days of fever, pinkeye, cough, sore throat, scant green sputum, transient aching. Has  used Neti pot. She did have flu shot. 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 CXR 09/20/09 IMPRESSION:  Stable mild hilar adenopathy. No active lung disease.  Provider: Bud FaceJanice Swinton  11/01/2014 Follow up for Sarcoid and GERD  Pt has Sarcoid. Last seen 12/2012 .  Returns for follow up  Says overall doing okay except for fatigue.  No cough, rash, joint pain, hemoptysis, or chest pain.  Today spirometry was normal with no airflow obstruction  FEV1 72%, ratio 80.  Last cxr w/ mild hilar adenopathy in 2010. Does complain of increased GERD few days ago,.  Swallowed a pill and felt it got stuck but drink water and gingerale with resolution w/in 24hr. Restarted PPI at onset and symptoms have improved.   Denies real increased SOB, wheezing, tightness, cough, purulent sputum, f/c/s, n/v/d, hemoptysis, PND, leg swelling  ROS-see HPI Constitutional:   No  weight loss, night sweats,  Fevers, chills, +fatigue, or  lassitude.  HEENT:   No headaches,  Difficulty swallowing,  Tooth/dental problems, or  Sore throat,                No sneezing, itching, ear ache, nasal congestion, post nasal drip,   CV:  No chest pain,  Orthopnea, PND, swelling in lower extremities, anasarca, dizziness, palpitations, syncope.   GI  No heartburn, indigestion, abdominal pain, nausea, vomiting, diarrhea, change in bowel habits, loss of appetite, bloody stools.   Resp:   No chest wall deformity  Skin: no rash or lesions.  GU: no dysuria, change in color of urine, no urgency or frequency.  No flank pain, no hematuria   MS:  No joint pain or swelling.  No decreased range of motion.  No back pain.  Psych:  No change in mood or affect. No depression or anxiety.  No memory loss.       Objective:  OBJ- Physical Exam GEN: A/Ox3; pleasant , NAD, well nourished   HEENT:  [place]/AT,  EACs-clear, TMs-wnl, NOSE-clear, THROAT-clear, no lesions, no postnasal drip or  exudate noted.   NECK:  Supple w/ fair ROM; no JVD; normal carotid impulses w/o bruits; no thyromegaly or nodules palpated; no lymphadenopathy.  RESP  Clear  P & A; w/o, wheezes/ rales/ or rhonchi.no accessory muscle use, no dullness to percussion  CARD:  RRR, no m/r/g  , no peripheral edema, pulses intact, no cyanosis or clubbing.  GI:   Soft & nt; nml bowel sounds; no organomegaly or masses detected.  Musco: Warm bil, no deformities or joint swelling noted.   Neuro: alert, no focal deficits noted.    Skin: Warm, no lesions or rashes

## 2014-11-01 NOTE — Addendum Note (Signed)
Addended by: Boone MasterJONES, Lyne Khurana E on: 11/01/2014 05:21 PM   Modules accepted: Orders

## 2014-11-01 NOTE — Assessment & Plan Note (Addendum)
Does not appear to have an active flare  Check cxr today  Spirometry is stable  follow up Dr. Maple HudsonYoung  In 1 year and As needed

## 2014-11-01 NOTE — Assessment & Plan Note (Signed)
Advised on GERD diet  Continue on Nexium otc As needed

## 2014-11-01 NOTE — Patient Instructions (Signed)
Continue on current regimen. Chest x-ray today Follow with Dr. Maple HudsonYoung in 1 year and as needed

## 2014-11-04 NOTE — Progress Notes (Signed)
Quick Note:  Pt aware of results/recs.  Scheduled with CY on 12/22 @9 :00.  Nothing further needed. ______

## 2014-12-13 ENCOUNTER — Encounter: Payer: Self-pay | Admitting: Internal Medicine

## 2014-12-13 ENCOUNTER — Ambulatory Visit (INDEPENDENT_AMBULATORY_CARE_PROVIDER_SITE_OTHER): Payer: 59 | Admitting: Internal Medicine

## 2014-12-13 ENCOUNTER — Other Ambulatory Visit (INDEPENDENT_AMBULATORY_CARE_PROVIDER_SITE_OTHER): Payer: 59

## 2014-12-13 ENCOUNTER — Ambulatory Visit (INDEPENDENT_AMBULATORY_CARE_PROVIDER_SITE_OTHER)
Admission: RE | Admit: 2014-12-13 | Discharge: 2014-12-13 | Disposition: A | Discharge status: Home / Self Care | Payer: 59 | Source: Ambulatory Visit | Attending: Internal Medicine | Admitting: Internal Medicine

## 2014-12-13 VITALS — BP 122/78 | HR 96 | Ht 62.5 in | Wt 140.6 lb

## 2014-12-13 DIAGNOSIS — D869 Sarcoidosis, unspecified: Secondary | ICD-10-CM

## 2014-12-13 DIAGNOSIS — J069 Acute upper respiratory infection, unspecified: Secondary | ICD-10-CM

## 2014-12-13 LAB — CBC WITH DIFFERENTIAL/PLATELET
BASOS PCT: 0.3 % (ref 0.0–3.0)
Basophils Absolute: 0 10*3/uL (ref 0.0–0.1)
Eosinophils Absolute: 0.4 10*3/uL (ref 0.0–0.7)
Eosinophils Relative: 5.7 % — ABNORMAL HIGH (ref 0.0–5.0)
HEMATOCRIT: 40.4 % (ref 36.0–46.0)
HEMOGLOBIN: 13.3 g/dL (ref 12.0–15.0)
LYMPHS ABS: 2.1 10*3/uL (ref 0.7–4.0)
Lymphocytes Relative: 32.6 % (ref 12.0–46.0)
MCHC: 33 g/dL (ref 30.0–36.0)
MCV: 86.4 fl (ref 78.0–100.0)
MONO ABS: 0.6 10*3/uL (ref 0.1–1.0)
Monocytes Relative: 9.9 % (ref 3.0–12.0)
NEUTROS ABS: 3.3 10*3/uL (ref 1.4–7.7)
Neutrophils Relative %: 51.5 % (ref 43.0–77.0)
Platelets: 342 10*3/uL (ref 150.0–400.0)
RBC: 4.68 Mil/uL (ref 3.87–5.11)
RDW: 14.1 % (ref 11.5–15.5)
WBC: 6.3 10*3/uL (ref 4.0–10.5)

## 2014-12-13 LAB — HEPATIC FUNCTION PANEL
ALT: 18 U/L (ref 0–35)
AST: 22 U/L (ref 0–37)
Albumin: 4.1 g/dL (ref 3.5–5.2)
Alkaline Phosphatase: 66 U/L (ref 39–117)
Bilirubin, Direct: 0 mg/dL (ref 0.0–0.3)
TOTAL PROTEIN: 8.7 g/dL — AB (ref 6.0–8.3)
Total Bilirubin: 0.5 mg/dL (ref 0.2–1.2)

## 2014-12-13 NOTE — Patient Instructions (Signed)
Order- lab- ACE level, CBC w diff, hepatic enzyme panel         Dx sarcoid              CXR- hx sarcoid  Please call as needed

## 2014-12-13 NOTE — Progress Notes (Signed)
Subjective:    Patient ID: Maria BeckmannGeorgianna Yerger, female    DOB: [birth year], 58 y.o.   MRN: [id number]  HPI 09/20/09- hx sarcoid, GERD  CT showed marked lymhadenopathy, c/w her sarcoid. We brought her back to discuss that, The last ACE was 06/23/07- 28 (9-67). She feels fine.She notes occasional mild indgestion after eating. Joints sometimes minor stiff/ aching. Not coughing.  CT reviewed by me and discussed with her- prominent cevical adenopathy c/w sarcoid.   July 20, 2010- Hx Sarcoid, GERD  CXR 09/20/09- stable mild hilar adenopathy  ACE level 09/20/09- 22 (9-67)  Feels more breathless/ labored in current code orange air quality. Denies night sweats, fever, swollen glands or rash. Not coughing. She has more trouble dealing with hot weather and recognizes that affects her on her drive home, but she is not really descirbing dypnea otherwise.   September 18, 2010- Hx Sarcoid, GERD  Feels well w/o night sweat, cough, chest pain, change in nodes or rash. Likes decreased humidity.  CXR last year had shown persistent stable hilar nodes, NAD.  PFT- 08/07/10 Mild restriction and obstruction. FEV1 1.58/ 68%; FEV1/FVC 0.79; DLCO 64; TLC 75%  6MWT 100, 100, 100%, 463 m.   05/13/11 Acute OV Presents for an acute office visit. Complains of  chest congestion, dry cough, wheezing, f/c/s x6days . OTC  Are not helping. Cough is mainly dry. Has a lot of drainage in throat. Feels feverish. No chest pain or dyspnea.  Cough is keeping her up at night.  Feels worn out from coughing. NO energy.  Appetite is okay.   12/29/12- [age] yoF never smoker followed for Hx Sarcoid, GERD  FOLLOWS FOR: last seen 08-2010 by CY; seen 04-2011 by TP. Cough, pink/red color to right eye, wheezing but denies any SOB. History of sarcoid in long-term remission but with chronic cervical adenopathy and mild restriction on PFTs 08/07/2010. Acute illness with 4 days of fever, pinkeye, cough, sore throat, scant green sputum, transient aching. Has  used Neti pot. She did have flu shot. 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 CXR 09/20/09 IMPRESSION:  Stable mild hilar adenopathy. No active lung disease.  Provider: Bud FaceJanice Swinton  11/01/2014 Follow up for Sarcoid and GERD  Pt has Sarcoid. Last seen 12/2012 .  Returns for follow up  Says overall doing okay except for fatigue.  No cough, rash, joint pain, hemoptysis, or chest pain.  Today spirometry was normal with no airflow obstruction  FEV1 72%, ratio 80.  Last cxr w/ mild hilar adenopathy in 2010. Does complain of increased GERD few days ago,.  Swallowed a pill and felt it got stuck but drink water and gingerale with resolution w/in 24hr. Restarted PPI at onset and symptoms have improved.   Denies real increased SOB, wheezing, tightness, cough, purulent sputum, f/c/s, n/v/d, hemoptysis, PND, leg swelling  12/13/14- 58 yoF never smoker followed for Hx Sarcoid, GERD  FOLLOWS FOR: Pt denies any wheezing, cough, congestion, or SOB. She is having slight nasal drainage-using her allergy meds to help.  She had a recent cold and is using Netti pot and Delsym cough syrup, with no purulent discharge or fever. CXR 11/02/14- IMPRESSION: Stable mild left hilar adenopathy. Very mild left perihilar infiltrate present. Electronically Signed  By: Maisie Fushomas Register  On: 11/02/2014 09:09  ROS-see HPI Constitutional:   No-   weight loss, night sweats, fevers, chills, fatigue, lassitude. HEENT:   No-  headaches, difficulty swallowing, tooth/dental problems, sore throat,  No-  sneezing, itching, ear ache, nasal congestion, +post nasal drip,  CV:  No-   chest pain, orthopnea, PND, swelling in lower extremities, anasarca,                                  dizziness, palpitations Resp: No-   shortness of breath with exertion or at rest.              No-   productive cough,  No non-productive cough,  No- coughing up of blood.               No-   change in color of mucus.  No- wheezing.   Skin: No-   rash or lesions. GI:  No-   heartburn, indigestion, abdominal pain, nausea,  GU: . MS:  No-   joint pain or swelling.  . Neuro-     nothing unusual Psych:  No- change in mood or affect. No depression or anxiety.  No memory loss.   Objective:   OBJ- Physical Exam General- Alert, Oriented, Affect-appropriate, Distress- none acute Skin- rash-none, lesions- none, excoriation- none Lymphadenopathy- + chronic fullness bilateral supraclavicular nodes Head- atraumatic            Eyes- Gross vision intact, PERRLA, conjunctivae and secretions clear            Ears- Hearing, canals-normal            Nose- Clear, no-Septal dev, mucus, polyps, erosion, perforation, +sniffing            Throat- Mallampati III , mucosa clear , drainage- none, tonsils- atrophic Neck- flexible , trachea midline, no stridor , thyroid nl, carotid no bruit Chest - symmetrical excursion , unlabored           Heart/CV- RRR , no murmur , no gallop  , no rub, nl s1 s2                           - JVD- none , edema- none, stasis changes- none, varices- none           Lung- clear to P&A, wheeze- none, cough- none , dullness-none, rub- none           Chest wall-  Abd- tender-no, distended-no, bowel sounds-present, HSM- no Br/ Gen/ Rectal- Not done, not indicated Extrem- cyanosis- none, clubbing, none, atrophy- none, strength- nl Neuro- grossly intact to observation

## 2014-12-13 NOTE — Assessment & Plan Note (Signed)
She has a head cold which is resolving. I don't think she'll have problems requiring antibiotics.

## 2014-12-13 NOTE — Assessment & Plan Note (Signed)
Chronic residual adenopathy in the supraclavicular and left mediastinal areas. I discussed expectation that sarcoid will burnout and probably will not require intervention. Because of hint of infiltrate on her last x-ray we will recheck. Plan -chest x-ray, ACE level

## 2014-12-14 LAB — ANGIOTENSIN CONVERTING ENZYME: ANGIOTENSIN-CONVERTING ENZYME: 31 U/L (ref 8–52)

## 2014-12-19 ENCOUNTER — Telehealth: Payer: Self-pay | Admitting: Internal Medicine

## 2014-12-19 NOTE — Telephone Encounter (Signed)
Results have been explained to patient, pt expressed understanding. Nothing further needed.  

## 2014-12-19 NOTE — Telephone Encounter (Signed)
Notes Recorded by Waymon Budgelinton D Young, MD on 12/14/2014 at 4:47 PM Blood tests look ok, including the ACE test for sarcoid activity, which is normal.  ------  lmomtcb for pt

## 2015-01-09 ENCOUNTER — Other Ambulatory Visit: Payer: Self-pay | Admitting: Obstetrics and Gynecology

## 2015-01-10 LAB — CYTOLOGY - PAP

## 2015-06-03 ENCOUNTER — Emergency Department (HOSPITAL_COMMUNITY): Payer: 59

## 2015-06-03 ENCOUNTER — Emergency Department (HOSPITAL_COMMUNITY)
Admission: EM | Admit: 2015-06-03 | Discharge: 2015-06-04 | Disposition: A | Discharge status: Home / Self Care | Payer: 59 | Attending: Emergency Medicine | Admitting: Emergency Medicine

## 2015-06-03 ENCOUNTER — Encounter (HOSPITAL_COMMUNITY): Payer: Self-pay | Admitting: Emergency Medicine

## 2015-06-03 DIAGNOSIS — M62830 Muscle spasm of back: Secondary | ICD-10-CM

## 2015-06-03 DIAGNOSIS — Y92410 Unspecified street and highway as the place of occurrence of the external cause: Secondary | ICD-10-CM | POA: Insufficient documentation

## 2015-06-03 DIAGNOSIS — Z79899 Other long term (current) drug therapy: Secondary | ICD-10-CM | POA: Insufficient documentation

## 2015-06-03 DIAGNOSIS — Y9389 Activity, other specified: Secondary | ICD-10-CM | POA: Insufficient documentation

## 2015-06-03 DIAGNOSIS — S29012A Strain of muscle and tendon of back wall of thorax, initial encounter: Secondary | ICD-10-CM | POA: Insufficient documentation

## 2015-06-03 DIAGNOSIS — S199XXA Unspecified injury of neck, initial encounter: Secondary | ICD-10-CM | POA: Diagnosis not present

## 2015-06-03 DIAGNOSIS — Z8709 Personal history of other diseases of the respiratory system: Secondary | ICD-10-CM | POA: Insufficient documentation

## 2015-06-03 DIAGNOSIS — I10 Essential (primary) hypertension: Secondary | ICD-10-CM | POA: Diagnosis not present

## 2015-06-03 DIAGNOSIS — S46812A Strain of other muscles, fascia and tendons at shoulder and upper arm level, left arm, initial encounter: Secondary | ICD-10-CM

## 2015-06-03 DIAGNOSIS — Y999 Unspecified external cause status: Secondary | ICD-10-CM | POA: Diagnosis not present

## 2015-06-03 DIAGNOSIS — E785 Hyperlipidemia, unspecified: Secondary | ICD-10-CM | POA: Diagnosis not present

## 2015-06-03 DIAGNOSIS — Z8719 Personal history of other diseases of the digestive system: Secondary | ICD-10-CM | POA: Insufficient documentation

## 2015-06-03 DIAGNOSIS — S3992XA Unspecified injury of lower back, initial encounter: Secondary | ICD-10-CM | POA: Diagnosis present

## 2015-06-03 MED ORDER — NAPROXEN 500 MG PO TABS
500.0000 mg | ORAL_TABLET | Freq: Once | ORAL | Status: AC
  Administered 2015-06-03: 500 mg via ORAL
  Filled 2015-06-03: qty 1

## 2015-06-03 MED ORDER — NAPROXEN 500 MG PO TABS: 500.0000 mg | ORAL_TABLET | Freq: Two times a day (BID) | ORAL | Status: DC

## 2015-06-03 MED ORDER — METHOCARBAMOL 500 MG PO TABS: 500.0000 mg | ORAL_TABLET | Freq: Two times a day (BID) | ORAL | Status: DC

## 2015-06-03 MED ORDER — METHOCARBAMOL 500 MG PO TABS
500.0000 mg | ORAL_TABLET | Freq: Once | ORAL | Status: AC
  Administered 2015-06-03: 500 mg via ORAL
  Filled 2015-06-03: qty 1
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
## 2015-06-12 ENCOUNTER — Telehealth: Payer: Self-pay | Admitting: Internal Medicine

## 2015-06-12 NOTE — Telephone Encounter (Signed)
lmomtcb x1 

## 2015-06-13 NOTE — Telephone Encounter (Signed)
Called and spoke to pt. Informed pt of the recs per CY. Pt verbalized understanding and stated she does not need a referral at this time but will call back if her insurance requires it. Nothing further needed at this time.

## 2015-06-13 NOTE — Telephone Encounter (Signed)
I am so sorry that happened, but glad it wasn't even worse ! Suggest best to see ENT for the problem with her throat.
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
## 2016-01-29 ENCOUNTER — Other Ambulatory Visit: Payer: Self-pay | Admitting: Orthopedic Surgery

## 2016-01-30 ENCOUNTER — Encounter (HOSPITAL_BASED_OUTPATIENT_CLINIC_OR_DEPARTMENT_OTHER): Payer: Self-pay | Admitting: *Deleted

## 2016-02-01 ENCOUNTER — Encounter (HOSPITAL_BASED_OUTPATIENT_CLINIC_OR_DEPARTMENT_OTHER)
Admission: RE | Admit: 2016-02-01 | Discharge: 2016-02-01 | Disposition: A | Discharge status: Home / Self Care | Payer: 59 | Source: Ambulatory Visit | Attending: Orthopedic Surgery | Admitting: Orthopedic Surgery

## 2016-02-01 DIAGNOSIS — I10 Essential (primary) hypertension: Secondary | ICD-10-CM | POA: Insufficient documentation

## 2016-02-01 DIAGNOSIS — Z01810 Encounter for preprocedural cardiovascular examination: Secondary | ICD-10-CM | POA: Insufficient documentation

## 2016-02-01 DIAGNOSIS — J069 Acute upper respiratory infection, unspecified: Secondary | ICD-10-CM | POA: Diagnosis not present

## 2016-02-01 DIAGNOSIS — Z5309 Procedure and treatment not carried out because of other contraindication: Secondary | ICD-10-CM | POA: Diagnosis not present

## 2016-02-01 DIAGNOSIS — G5601 Carpal tunnel syndrome, right upper limb: Secondary | ICD-10-CM | POA: Diagnosis present

## 2016-02-01 LAB — BASIC METABOLIC PANEL
Anion gap: 8 (ref 5–15)
BUN: 9 mg/dL (ref 6–20)
CHLORIDE: 104 mmol/L (ref 101–111)
CO2: 29 mmol/L (ref 22–32)
CREATININE: 0.7 mg/dL (ref 0.44–1.00)
Calcium: 9.9 mg/dL (ref 8.9–10.3)
GFR calc Af Amer: >60 mL/min (ref >60)
GFR calc non Af Amer: >60 mL/min (ref >60)
Glucose, Bld: 110 mg/dL — ABNORMAL HIGH (ref 65–99)
Potassium: 4.3 mmol/L (ref 3.5–5.1)
SODIUM: 141 mmol/L (ref 135–145)

## 2016-02-02 ENCOUNTER — Ambulatory Visit (HOSPITAL_BASED_OUTPATIENT_CLINIC_OR_DEPARTMENT_OTHER)
Admission: RE | Admit: 2016-02-02 | Discharge: 2016-02-02 | Disposition: A | Discharge status: Home / Self Care | Payer: 59 | Source: Ambulatory Visit | Attending: Orthopedic Surgery | Admitting: Orthopedic Surgery

## 2016-02-02 ENCOUNTER — Encounter (HOSPITAL_BASED_OUTPATIENT_CLINIC_OR_DEPARTMENT_OTHER)
Admission: RE | Disposition: A | Discharge status: Home / Self Care | Payer: Self-pay | Source: Ambulatory Visit | Attending: Orthopedic Surgery

## 2016-02-02 ENCOUNTER — Ambulatory Visit (HOSPITAL_BASED_OUTPATIENT_CLINIC_OR_DEPARTMENT_OTHER): Payer: 59 | Admitting: Anesthesiology

## 2016-02-02 ENCOUNTER — Encounter (HOSPITAL_BASED_OUTPATIENT_CLINIC_OR_DEPARTMENT_OTHER): Payer: Self-pay | Admitting: *Deleted

## 2016-02-02 DIAGNOSIS — G5601 Carpal tunnel syndrome, right upper limb: Secondary | ICD-10-CM | POA: Diagnosis not present

## 2016-02-02 DIAGNOSIS — J069 Acute upper respiratory infection, unspecified: Secondary | ICD-10-CM | POA: Insufficient documentation

## 2016-02-02 DIAGNOSIS — Z5309 Procedure and treatment not carried out because of other contraindication: Secondary | ICD-10-CM | POA: Insufficient documentation

## 2016-02-02 HISTORY — DX: Hypothyroidism, unspecified: E03.9

## 2016-02-02 SURGERY — CARPAL TUNNEL RELEASE
Anesthesia: Monitor Anesthesia Care | Laterality: Right

## 2016-02-02 MED ORDER — FENTANYL CITRATE (PF) 100 MCG/2ML IJ SOLN: 50.0000 ug | INTRAMUSCULAR | Status: DC | PRN

## 2016-02-02 MED ORDER — GLYCOPYRROLATE 0.2 MG/ML IJ SOLN: 0.2000 mg | Freq: Once | INTRAMUSCULAR | Status: DC | PRN

## 2016-02-02 MED ORDER — SCOPOLAMINE 1 MG/3DAYS TD PT72: 1.0000 | MEDICATED_PATCH | Freq: Once | TRANSDERMAL | Status: DC | PRN

## 2016-02-02 MED ORDER — MIDAZOLAM HCL 2 MG/2ML IJ SOLN: 1.0000 mg | INTRAMUSCULAR | Status: DC | PRN

## 2016-02-02 MED ORDER — CHLORHEXIDINE GLUCONATE 4 % EX LIQD: 60.0000 mL | Freq: Once | CUTANEOUS | Status: DC

## 2016-02-02 MED ORDER — VANCOMYCIN HCL IN DEXTROSE 1-5 GM/200ML-% IV SOLN: 1000.0000 mg | INTRAVENOUS | Status: DC

## 2016-02-02 MED ORDER — LACTATED RINGERS IV SOLN: INTRAVENOUS | Status: DC

## 2016-02-02 NOTE — Progress Notes (Signed)
Patient ID: Maria [name], female   DOB: [birth year], 60 y.o.   MRN: [id number] Patient surgery cancelled due to recent URI Reiss Mowrey MD

## 2016-02-02 NOTE — Progress Notes (Signed)
Dr. Amanda Pea cancelled case because family had the flu and patient running low grade fever

## 2016-02-02 NOTE — Anesthesia Preprocedure Evaluation (Signed)
Anesthesia Evaluation  Patient identified by MRN, date of birth, ID band Patient awake    Reviewed: Allergy & Precautions, NPO status , Patient's Chart, lab work & pertinent test results  Airway Mallampati: II   Neck ROM: full    Dental   Pulmonary shortness of breath,    breath sounds clear to auscultation       Cardiovascular hypertension,  Rhythm:regular Rate:Normal     Neuro/Psych    GI/Hepatic GERD  ,  Endo/Other  Hypothyroidism   Renal/GU      Musculoskeletal   Abdominal   Peds  Hematology   Anesthesia Other Findings   Reproductive/Obstetrics                             Anesthesia Physical Anesthesia Plan  ASA: II  Anesthesia Plan: MAC   Post-op Pain Management:    Induction: Intravenous  Airway Management Planned: Simple Face Mask  Additional Equipment:   Intra-op Plan:   Post-operative Plan:   Informed Consent: I have reviewed the patients History and Physical, chart, labs and discussed the procedure including the risks, benefits and alternatives for the proposed anesthesia with the patient or authorized representative who has indicated his/her understanding and acceptance.     Plan Discussed with: CRNA, Anesthesiologist and Surgeon  Anesthesia Plan Comments:         Anesthesia Quick Evaluation

## 2016-02-05 ENCOUNTER — Other Ambulatory Visit: Payer: Self-pay | Admitting: Orthopedic Surgery

## 2016-02-26 ENCOUNTER — Encounter (HOSPITAL_BASED_OUTPATIENT_CLINIC_OR_DEPARTMENT_OTHER): Payer: Self-pay | Admitting: *Deleted

## 2016-03-01 ENCOUNTER — Ambulatory Visit (HOSPITAL_BASED_OUTPATIENT_CLINIC_OR_DEPARTMENT_OTHER): Payer: 59 | Admitting: Anesthesiology

## 2016-03-01 ENCOUNTER — Encounter (HOSPITAL_BASED_OUTPATIENT_CLINIC_OR_DEPARTMENT_OTHER): Payer: Self-pay | Admitting: Anesthesiology

## 2016-03-01 ENCOUNTER — Ambulatory Visit (HOSPITAL_BASED_OUTPATIENT_CLINIC_OR_DEPARTMENT_OTHER)
Admission: RE | Admit: 2016-03-01 | Discharge: 2016-03-01 | Disposition: A | Discharge status: Home / Self Care | Payer: 59 | Source: Ambulatory Visit | Attending: Orthopedic Surgery | Admitting: Orthopedic Surgery

## 2016-03-01 ENCOUNTER — Encounter (HOSPITAL_BASED_OUTPATIENT_CLINIC_OR_DEPARTMENT_OTHER)
Admission: RE | Disposition: A | Discharge status: Home / Self Care | Payer: Self-pay | Source: Ambulatory Visit | Attending: Orthopedic Surgery

## 2016-03-01 DIAGNOSIS — E039 Hypothyroidism, unspecified: Secondary | ICD-10-CM | POA: Insufficient documentation

## 2016-03-01 DIAGNOSIS — G5601 Carpal tunnel syndrome, right upper limb: Secondary | ICD-10-CM | POA: Insufficient documentation

## 2016-03-01 DIAGNOSIS — Z880 Allergy status to penicillin: Secondary | ICD-10-CM | POA: Diagnosis not present

## 2016-03-01 DIAGNOSIS — I10 Essential (primary) hypertension: Secondary | ICD-10-CM | POA: Diagnosis not present

## 2016-03-01 DIAGNOSIS — E785 Hyperlipidemia, unspecified: Secondary | ICD-10-CM | POA: Insufficient documentation

## 2016-03-01 HISTORY — PX: MINOR CARPAL TUNNEL: SHX6472

## 2016-03-01 SURGERY — MINOR CARPAL TUNNEL
Anesthesia: Monitor Anesthesia Care | Site: Wrist | Laterality: Right

## 2016-03-01 MED ORDER — HYDROCODONE-ACETAMINOPHEN 5-325 MG PO TABS: 2.0000 | ORAL_TABLET | Freq: Four times a day (QID) | ORAL | Status: DC | PRN

## 2016-03-01 MED ORDER — SCOPOLAMINE 1 MG/3DAYS TD PT72: 1.0000 | MEDICATED_PATCH | Freq: Once | TRANSDERMAL | Status: DC | PRN

## 2016-03-01 MED ORDER — MIDAZOLAM HCL 2 MG/2ML IJ SOLN
INTRAMUSCULAR | Status: AC
  Filled 2016-03-01: qty 2

## 2016-03-01 MED ORDER — FENTANYL CITRATE (PF) 100 MCG/2ML IJ SOLN: 25.0000 ug | INTRAMUSCULAR | Status: DC | PRN

## 2016-03-01 MED ORDER — VANCOMYCIN HCL IN DEXTROSE 1-5 GM/200ML-% IV SOLN
INTRAVENOUS | Status: AC
  Filled 2016-03-01: qty 200

## 2016-03-01 MED ORDER — CHLORHEXIDINE GLUCONATE 4 % EX LIQD: 60.0000 mL | Freq: Once | CUTANEOUS | Status: DC

## 2016-03-01 MED ORDER — MIDAZOLAM HCL 2 MG/2ML IJ SOLN: 1.0000 mg | INTRAMUSCULAR | Status: DC | PRN

## 2016-03-01 MED ORDER — SODIUM BICARBONATE 4 % IV SOLN
INTRAVENOUS | Status: DC | PRN
  Administered 2016-03-01: 20 mL via INTRAMUSCULAR

## 2016-03-01 MED ORDER — FENTANYL CITRATE (PF) 100 MCG/2ML IJ SOLN: 50.0000 ug | INTRAMUSCULAR | Status: DC | PRN

## 2016-03-01 MED ORDER — VANCOMYCIN HCL IN DEXTROSE 1-5 GM/200ML-% IV SOLN: 1000.0000 mg | INTRAVENOUS | Status: DC

## 2016-03-01 MED ORDER — SODIUM CHLORIDE 0.45 % IV SOLN: INTRAVENOUS | Status: DC

## 2016-03-01 MED ORDER — LACTATED RINGERS IV SOLN: INTRAVENOUS | Status: DC

## 2016-03-01 MED ORDER — GLYCOPYRROLATE 0.2 MG/ML IJ SOLN: 0.2000 mg | Freq: Once | INTRAMUSCULAR | Status: DC | PRN

## 2016-03-01 MED ORDER — ONDANSETRON HCL 4 MG/2ML IJ SOLN
INTRAMUSCULAR | Status: AC
  Filled 2016-03-01: qty 2

## 2016-03-01 MED ORDER — PROMETHAZINE HCL 25 MG/ML IJ SOLN: 6.2500 mg | INTRAMUSCULAR | Status: DC | PRN

## 2016-03-01 MED ORDER — FENTANYL CITRATE (PF) 100 MCG/2ML IJ SOLN
INTRAMUSCULAR | Status: AC
  Filled 2016-03-01: qty 2

## 2016-03-01 MED ORDER — KETOROLAC TROMETHAMINE 30 MG/ML IJ SOLN: 30.0000 mg | Freq: Once | INTRAMUSCULAR | Status: DC | PRN

## 2016-03-01 SURGICAL SUPPLY — 47 items
BANDAGE ACE 3X5.8 VEL STRL LF (GAUZE/BANDAGES/DRESSINGS) ×3 IMPLANT
BLADE CARPAL TUNNEL SNGL USE (BLADE) ×3 IMPLANT
BLADE SURG 15 STRL LF DISP TIS (BLADE) ×4 IMPLANT
BLADE SURG 15 STRL SS (BLADE) ×2
BNDG CONFORM 3 STRL LF (GAUZE/BANDAGES/DRESSINGS) ×3 IMPLANT
BRUSH SCRUB EZ PLAIN DRY (MISCELLANEOUS) ×3 IMPLANT
CORDS BIPOLAR (ELECTRODE) ×3 IMPLANT
COVER BACK TABLE 60X90IN (DRAPES) ×3 IMPLANT
CUFF TOURNIQUET SINGLE 18IN (TOURNIQUET CUFF) IMPLANT
DRAIN PENROSE 1/4X12 LTX STRL (WOUND CARE) IMPLANT
DRAPE EXTREMITY T 121X128X90 (DRAPE) ×3 IMPLANT
DRAPE SURG 17X23 STRL (DRAPES) ×3 IMPLANT
DRSG EMULSION OIL 3X3 NADH (GAUZE/BANDAGES/DRESSINGS) ×3 IMPLANT
GAUZE SPONGE 4X4 12PLY STRL (GAUZE/BANDAGES/DRESSINGS) IMPLANT
GAUZE SPONGE 4X4 16PLY XRAY LF (GAUZE/BANDAGES/DRESSINGS) IMPLANT
GAUZE XEROFORM 1X8 LF (GAUZE/BANDAGES/DRESSINGS) ×3 IMPLANT
GLOVE BIO SURGEON STRL SZ 6.5 (GLOVE) ×3 IMPLANT
GLOVE BIOGEL M STRL SZ7.5 (GLOVE) IMPLANT
GLOVE BIOGEL PI IND STRL 7.0 (GLOVE) ×4 IMPLANT
GLOVE BIOGEL PI INDICATOR 7.0 (GLOVE) ×2
GLOVE SS BIOGEL STRL SZ 8 (GLOVE) ×2 IMPLANT
GLOVE SUPERSENSE BIOGEL SZ 8 (GLOVE) ×1
GOWN STRL REUS W/ TWL LRG LVL3 (GOWN DISPOSABLE) ×2 IMPLANT
GOWN STRL REUS W/ TWL XL LVL3 (GOWN DISPOSABLE) ×2 IMPLANT
GOWN STRL REUS W/TWL LRG LVL3 (GOWN DISPOSABLE) ×1
GOWN STRL REUS W/TWL XL LVL3 (GOWN DISPOSABLE) ×1
LOOP VESSEL MAXI BLUE (MISCELLANEOUS) IMPLANT
NDL SAFETY ECLIPSE 18X1.5 (NEEDLE) ×2 IMPLANT
NEEDLE HYPO 18GX1.5 SHARP (NEEDLE) ×1
NEEDLE HYPO 22GX1.5 SAFETY (NEEDLE) IMPLANT
NEEDLE HYPO 25X1 1.5 SAFETY (NEEDLE) ×6 IMPLANT
NS IRRIG 1000ML POUR BTL (IV SOLUTION) ×3 IMPLANT
PACK BASIN DAY SURGERY FS (CUSTOM PROCEDURE TRAY) ×3 IMPLANT
PAD ALCOHOL SWAB (MISCELLANEOUS) ×36 IMPLANT
PAD CAST 3X4 CTTN HI CHSV (CAST SUPPLIES) ×4 IMPLANT
PADDING CAST ABS 4INX4YD NS (CAST SUPPLIES) ×1
PADDING CAST ABS COTTON 4X4 ST (CAST SUPPLIES) ×2 IMPLANT
PADDING CAST COTTON 3X4 STRL (CAST SUPPLIES) ×2
SHEET MEDIUM DRAPE 40X70 STRL (DRAPES) ×3 IMPLANT
STOCKINETTE 4X48 STRL (DRAPES) ×3 IMPLANT
SUT PROLENE 4 0 PS 2 18 (SUTURE) ×3 IMPLANT
SYR BULB 3OZ (MISCELLANEOUS) ×3 IMPLANT
SYR CONTROL 10ML LL (SYRINGE) ×6 IMPLANT
TOWEL OR 17X24 6PK STRL BLUE (TOWEL DISPOSABLE) ×3 IMPLANT
TOWEL OR NON WOVEN STRL DISP B (DISPOSABLE) ×3 IMPLANT
TRAY DSU PREP LF (CUSTOM PROCEDURE TRAY) ×3 IMPLANT
UNDERPAD 30X30 (UNDERPADS AND DIAPERS) ×3 IMPLANT

## 2016-03-01 NOTE — H&P (Signed)
Maria BeckmannGeorgianna [name] is an 60 y.o. female.   Chief Complaint: presents for R CTR HPI: patient presents for right CTR Patient presents for evaluation and treatment of the of their upper extremity predicament. The patient denies neck, back, chest or  abdominal pain. The patient notes that they have no lower extremity problems. The patients primary complaint is noted. We are planning surgical care pathway for the upper extremity.  Past Medical History  Diagnosis Date  . GERD (gastroesophageal reflux disease)   . Viral URI   . Pulmonary sarcoidosis (HCC)   . Hypertension   . Hyperlipidemia   . Sarcoidosis (HCC)   . Hypothyroidism     Past Surgical History  Procedure Laterality Date  . Mediastinal node biopsy      dx with sarcoid  . Abdominal hysterectomy    . Bunionectomy Bilateral     Family History  Problem Relation Age of Onset  . Heart disease Mother   . Heart disease Father   . Breast cancer Sister    Social History:  reports that she has never smoked. She has never used smokeless tobacco. She reports that she does not drink alcohol or use illicit drugs.  Allergies:  Allergies  Allergen Reactions  . Penicillins     REACTION: rash    Medications Prior to Admission  Medication Sig Dispense Refill  . CRESTOR 10 MG tablet Take 1 tablet by mouth daily.    Marland Kitchen. esomeprazole (NEXIUM) 40 MG capsule Take 40 mg by mouth daily at 12 noon.    . hydrochlorothiazide (HYDRODIURIL) 25 MG tablet Take 12.5 mg by mouth daily.    Marland Kitchen. levothyroxine (SYNTHROID, LEVOTHROID) 50 MCG tablet Take 50 mcg by mouth daily.      Marland Kitchen. loratadine (CLARITIN) 10 MG tablet Take 10 mg by mouth daily.      . B Complex-C (B-COMPLEX WITH VITAMIN C) tablet Take 1 tablet by mouth daily.      No results found for this or any previous visit (from the past 48 hour(s)). No results found.  Review of Systems  Respiratory: Negative.   Gastrointestinal: Negative.   Genitourinary: Negative.   Neurological: Negative.    Endo/Heme/Allergies: Negative.   Psychiatric/Behavioral: Negative.     Blood pressure 144/88, pulse 99, temperature 97.8 F (36.6 C), temperature source Oral, resp. rate 16, height 5\' 2"  (1.575 m), weight 62.199 kg (137 lb 2 oz), SpO2 100 %. Physical Exam  Right CTS with positive Phalen and MNCT The patient is alert and oriented in no acute distress. The patient complains of pain in the affected upper extremity.  The patient is noted to have a normal HEENT exam. Lung fields show equal chest expansion and no shortness of breath. Abdomen exam is nontender without distention. Lower extremity examination does not show any fracture dislocation or blood clot symptoms. Pelvis is stable and the neck and back are stable and nontender. Assessment/Plan We are planning surgery for your upper extremity. The risk and benefits of surgery to include risk of bleeding, infection, anesthesia,  damage to normal structures and failure of the surgery to accomplish its intended goals of relieving symptoms and restoring function have been discussed in detail. With this in mind we plan to proceed. I have specifically discussed with the patient the pre-and postoperative regime and the dos and don'ts and risk and benefits in great detail. Risk and benefits of surgery also include risk of dystrophy(CRPS), chronic nerve pain, failure of the healing process to go onto completion and other  inherent risks of surgery The relavent the pathophysiology of the disease/injury process, as well as the alternatives for treatment and postoperative course of action has been discussed in great detail with the patient who desires to proceed.  We will do everything in our power to help you (the patient) restore function to the upper extremity. It is a pleasure to see this patient today.   Karen Chafe, MD 03/01/2016, 9:36 AM

## 2016-03-01 NOTE — Anesthesia Postprocedure Evaluation (Signed)
Anesthesia Post Note  Patient: Maria BeckmannGeorgianna Sorbo  Procedure(s) Performed: Procedure(s) (LRB): RIGHT CARPAL TUNNEL RELEASE (Right)  Anesthesia Post Evaluation  Last Vitals:  Filed Vitals:   03/01/16 0909  BP: 144/88  Pulse: 99  Temp: 36.6 C  Resp: 16    Last Pain: There were no vitals filed for this visit.               Shanetra Blumenstock S

## 2016-03-01 NOTE — Op Note (Signed)
see dict #[id number] Esaiah Wanless MD

## 2016-03-01 NOTE — Anesthesia Preprocedure Evaluation (Signed)
Anesthesia Evaluation  Patient identified by MRN, date of birth, ID band Patient awake    Reviewed: Allergy & Precautions, NPO status , Patient's Chart, lab work & pertinent test results  Airway Mallampati: II  TM Distance: >3 FB Neck ROM: Full    Dental no notable dental hx.    Pulmonary neg pulmonary ROS,    Pulmonary exam normal breath sounds clear to auscultation       Cardiovascular hypertension, Pt. on medications Normal cardiovascular exam Rhythm:Regular Rate:Normal     Neuro/Psych negative neurological ROS  negative psych ROS   GI/Hepatic Neg liver ROS, GERD  Medicated,  Endo/Other  negative endocrine ROS  Renal/GU negative Renal ROS  negative genitourinary   Musculoskeletal negative musculoskeletal ROS (+)   Abdominal   Peds negative pediatric ROS (+)  Hematology negative hematology ROS (+)   Anesthesia Other Findings   Reproductive/Obstetrics negative OB ROS                             Anesthesia Physical Anesthesia Plan  ASA: II  Anesthesia Plan: MAC   Post-op Pain Management:    Induction: Intravenous  Airway Management Planned: Simple Face Mask  Additional Equipment:   Intra-op Plan:   Post-operative Plan:   Informed Consent: I have reviewed the patients History and Physical, chart, labs and discussed the procedure including the risks, benefits and alternatives for the proposed anesthesia with the patient or authorized representative who has indicated his/her understanding and acceptance.   Dental advisory given  Plan Discussed with: CRNA and Surgeon  Anesthesia Plan Comments:         Anesthesia Quick Evaluation

## 2016-03-01 NOTE — Discharge Instructions (Signed)

## 2016-03-04 ENCOUNTER — Encounter (HOSPITAL_BASED_OUTPATIENT_CLINIC_OR_DEPARTMENT_OTHER): Payer: Self-pay | Admitting: Orthopedic Surgery

## 2016-03-04 NOTE — Op Note (Signed)
NAMHarl Bowie:  Maria [name], Maria [name]         ACCOUNT NO.:  [id number]  MEDICAL RECORD NO.:  [id number]  LOCATION:                                 FACILITY:  PHYSICIAN:  Dionne AnoWilliam M. Windsor Zirkelbach, M.D.DATE OF BIRTH:  05/17/1956  DATE OF PROCEDURE: DATE OF DISCHARGE:                              OPERATIVE REPORT   PREOPERATIVE DIAGNOSIS:  Carpal tunnel syndrome, right upper extremity.  POSTOPERATIVE DIAGNOSIS:  Carpal tunnel syndrome, right upper extremity.  PROCEDURES: 1. Right median nerve/peripheral nerve block at wrist forearm level     for anesthetic purposes for carpal tunnel release. 2. Right limited open carpal tunnel release.  SURGEON:  Dionne AnoWilliam M. Amanda PeaGramig, MD  ASSISTANT:  None.  COMPLICATIONS:  None.  ANESTHESIA:  Straight local anesthetic with Sensorcaine lidocaine block.  DESCRIPTION OF PROCEDURE:  The patient was seen by myself and Anesthesia.  She had difficulty obtaining venous access and thus we proceeded with straight local anesthetic performed by myself.  Patient was consented, taken to the procedure suite, underwent a median nerve/field block without difficulty utilizing Sensorcaine/lidocaine approximately 18-20 mL.  Following this, she was prepped and draped 2nd time with Betadine scrub and paint.  Once this was complete, the patient had the arm elevated.  Tourniquet was insufflated 250 mmHg and a time- out was observed.  Incision was made.  Dissection was carried down. Following the incision, the patient had retractor placed.  Palmar fascia incised.  Distal edge of transverse carpal ligament identified and released under 4.0 loupe magnification.  Following this, distal-to-proximal dissection was carried out until adequate room was available for canal, prepared toward devices 1, 2, and 3 which were placed just under the proximal leading leaflet of the transcarpal ligament.  Following this, security clip was placed, obturator disengaged, and the security knife was placed  and the security clip effectively releasing the proximal leaflet of the transcarpal ligament. She tolerated this well.  She was awake, alert, and oriented during all points and passes.  This was an uncomplicated carpal tunnel release. The security knife effectively released the proximal leaflet via the security clip placement and engagement.  We irrigated copiously, obtained hemostasis with bipolar electrocautery and all looked quite well.  Once again, this was a straight local limited open carpal tunnel release without difficulty.  The patient was awake, alert, and oriented in the entire case and did beautifully.  No complicating features.  We will rehab her according to our standard postop algorithm as per note. Notify me if problems occur.  These notes have been discussed.  All questions addressed.     Dionne AnoWilliam M. Amanda PeaGramig, M.D.     [facility]  D:  03/01/2016  T:  03/01/2016  Job:  [id number]

## 2016-05-07 ENCOUNTER — Encounter: Payer: Self-pay | Admitting: Internal Medicine

## 2016-06-14 ENCOUNTER — Encounter: Payer: Self-pay | Admitting: Internal Medicine

## 2016-08-09 ENCOUNTER — Encounter (INDEPENDENT_AMBULATORY_CARE_PROVIDER_SITE_OTHER): Payer: Self-pay

## 2016-08-09 ENCOUNTER — Ambulatory Visit (AMBULATORY_SURGERY_CENTER): Payer: Self-pay | Admitting: *Deleted

## 2016-08-09 VITALS — Ht 62.5 in | Wt 141.0 lb

## 2016-08-09 DIAGNOSIS — Z1211 Encounter for screening for malignant neoplasm of colon: Secondary | ICD-10-CM

## 2016-08-09 MED ORDER — NA SULFATE-K SULFATE-MG SULF 17.5-3.13-1.6 GM/177ML PO SOLN: 1.0000 | Freq: Once | ORAL | 0 refills | Status: AC

## 2016-08-09 NOTE — Progress Notes (Signed)
Denies allergies to eggs or soy products. Denies complications with sedation or anesthesia. Denies O2 use. Denies use of diet or weight loss medications.  Emmi instructions given for colonoscopy.  

## 2016-08-13 ENCOUNTER — Encounter: Payer: Self-pay | Admitting: Internal Medicine

## 2016-08-19 ENCOUNTER — Telehealth: Payer: Self-pay | Admitting: Internal Medicine

## 2016-08-19 NOTE — Telephone Encounter (Signed)
Discussed medications with patient.  Answered all questions and concerns.

## 2016-08-23 ENCOUNTER — Ambulatory Visit (AMBULATORY_SURGERY_CENTER): Payer: 59 | Admitting: Internal Medicine

## 2016-08-23 ENCOUNTER — Encounter: Payer: Self-pay | Admitting: Internal Medicine

## 2016-08-23 VITALS — BP 132/71 | HR 82 | Temp 96.9°F | Resp 14 | Ht 62.5 in | Wt 141.0 lb

## 2016-08-23 DIAGNOSIS — Z1211 Encounter for screening for malignant neoplasm of colon: Secondary | ICD-10-CM

## 2016-08-23 MED ORDER — SODIUM CHLORIDE 0.9 % IV SOLN: 500.0000 mL | INTRAVENOUS | Status: DC

## 2016-08-23 NOTE — Progress Notes (Signed)
Report to PACU, RN, vss, BBS= Clear.  

## 2016-08-23 NOTE — Op Note (Signed)
[facility] Patient Name: Maria BeckmannGeorgianna [name] Procedure Date: 08/23/2016 8:44 AM MRN: [id number] Endoscopist: Wilhemina BonitoJohn N. Marina GoodellPerry , MD Age: [age] Referring MD:  Date of Birth: 04-02-1956 Gender: Female Account #: [id number] Procedure:                Colonoscopy Indications:              Screening for colorectal malignant neoplasm. Prior                            examination May 2007 with Dr. Bosie ClosSchooler negative for                            neoplasia Medicines:                Monitored Anesthesia Care Procedure:                Pre-Anesthesia Assessment:                           - Prior to the procedure, a History and Physical                            was performed, and patient medications and                            allergies were reviewed. The patient's tolerance of                            previous anesthesia was also reviewed. The risks                            and benefits of the procedure and the sedation                            options and risks were discussed with the patient.                            All questions were answered, and informed consent                            was obtained. Prior Anticoagulants: The patient has                            taken no previous anticoagulant or antiplatelet                            agents. ASA Grade Assessment: II - A patient with                            mild systemic disease. After reviewing the risks                            and benefits, the patient was deemed in  satisfactory condition to undergo the procedure.                           After obtaining informed consent, the colonoscope                            was passed under direct vision. Throughout the                            procedure, the patient's blood pressure, pulse, and                            oxygen saturations were monitored continuously. The                            Model CF-HQ190L [id number]) scope was  introduced                            through the anus and advanced to the the cecum,                            identified by appendiceal orifice and ileocecal                            valve. The ileocecal valve, appendiceal orifice,                            and rectum were photographed. The quality of the                            bowel preparation was excellent. The colonoscopy                            was performed without difficulty. The patient                            tolerated the procedure well. The bowel preparation                            used was SUPREP. Scope In: 8:52:42 AM Scope Out: 9:04:45 AM Scope Withdrawal Time: 0 hours 9 minutes 27 seconds  Total Procedure Duration: 0 hours 12 minutes 3 seconds  Findings:                 Multiple medium-mouthed diverticula were found in                            the sigmoid colon.                           The exam was otherwise without abnormality on                            direct and retroflexion views. Complications:            No immediate complications.  Estimated blood loss:                            None. Estimated Blood Loss:     Estimated blood loss: none. Impression:               - Diverticulosis in the sigmoid colon.                           - The examination was otherwise normal on direct                            and retroflexion views.                           - No specimens collected. Recommendation:           - Repeat colonoscopy in 10 years for screening                            purposes.                           - Patient has a contact number available for                            emergencies. The signs and symptoms of potential                            delayed complications were discussed with the                            patient. Return to normal activities tomorrow.                            Written discharge instructions were provided to the                            patient.                            - Resume previous diet.                           - Continue present medications. Wilhemina Bonito. Marina Goodell, MD 08/23/2016 9:08:01 AM This report has been signed electronically.

## 2016-08-23 NOTE — Patient Instructions (Signed)
YOU HAD AN ENDOSCOPIC PROCEDURE TODAY AT THE [facility]:   Refer to the procedure report that was given to you for any specific questions about what was found during the examination.  If the procedure report does not answer your questions, please call your gastroenterologist to clarify.  If you requested that your care partner not be given the details of your procedure findings, then the procedure report has been included in a sealed envelope for you to review at your convenience later.  YOU SHOULD EXPECT: Some feelings of bloating in the abdomen. Passage of more gas than usual.  Walking can help get rid of the air that was put into your GI tract during the procedure and reduce the bloating. If you had a lower endoscopy (such as a colonoscopy or flexible sigmoidoscopy) you may notice spotting of blood in your stool or on the toilet paper. If you underwent a bowel prep for your procedure, you may not have a normal bowel movement for a few days.  Please Note:  You might notice some irritation and congestion in your nose or some drainage.  This is from the oxygen used during your procedure.  There is no need for concern and it should clear up in a day or so.  SYMPTOMS TO REPORT IMMEDIATELY:   Following lower endoscopy (colonoscopy or flexible sigmoidoscopy):  Excessive amounts of blood in the stool  Significant tenderness or worsening of abdominal pains  Swelling of the abdomen that is new, acute  Fever of 100F or higher   For urgent or emergent issues, a gastroenterologist can be reached at any hour by calling [phone].   DIET:  We do recommend a small meal at first, but then you may proceed to your regular diet.  Drink plenty of fluids but you should avoid alcoholic beverages for 24 hours.  ACTIVITY:  You should plan to take it easy for the rest of today and you should NOT DRIVE or use heavy machinery until tomorrow (because of the sedation medicines used during the test).     FOLLOW UP: Our staff will call the number listed on your records the next business day following your procedure to check on you and address any questions or concerns that you may have regarding the information given to you following your procedure. If we do not reach you, we will leave a message.  However, if you are feeling well and you are not experiencing any problems, there is no need to return our call.  We will assume that you have returned to your regular daily activities without incident.  If any biopsies were taken you will be contacted by phone or by letter within the next 1-3 weeks.  Please call us at [phone] if you have not heard about the biopsies in 3 weeks.    SIGNATURES/CONFIDENTIALITY: You and/or your care partner have signed paperwork which will be entered into your electronic medical record.  These signatures attest to the fact that that the information above on your After Visit Summary has been reviewed and is understood.  Full responsibility of the confidentiality of this discharge information lies with you and/or your care-partner.  Diverticulosis and high fiber diet information given.,

## 2016-08-27 ENCOUNTER — Telehealth: Payer: Self-pay

## 2016-08-27 NOTE — Telephone Encounter (Signed)
  Follow up Call-  Call back number 08/23/2016  Post procedure Call Back phone  # [phone]  Permission to leave phone message Yes  Some recent data might be hidden     Patient questions:  Do you have a fever, pain , or abdominal swelling? No. Pain Score  0 *  Have you tolerated food without any problems? Yes.    Have you been able to return to your normal activities? Yes.    Do you have any questions about your discharge instructions: Diet   No. Medications  No. Follow up visit  No.  Do you have questions or concerns about your Care? No.  Actions: * If pain score is 4 or above: No action needed, pain <4.  No problems per the pt. maw

## 2017-02-03 DIAGNOSIS — Z6824 Body mass index (BMI) 24.0-24.9, adult: Secondary | ICD-10-CM | POA: Diagnosis not present

## 2017-02-03 DIAGNOSIS — Z01419 Encounter for gynecological examination (general) (routine) without abnormal findings: Secondary | ICD-10-CM | POA: Diagnosis not present

## 2017-06-20 ENCOUNTER — Ambulatory Visit: Payer: 59 | Admitting: Podiatry

## 2017-09-01 DIAGNOSIS — E039 Hypothyroidism, unspecified: Secondary | ICD-10-CM | POA: Diagnosis not present

## 2017-09-01 DIAGNOSIS — E782 Mixed hyperlipidemia: Secondary | ICD-10-CM | POA: Diagnosis not present

## 2017-09-01 DIAGNOSIS — Z0000 Encounter for general adult medical examination without abnormal findings: Secondary | ICD-10-CM | POA: Diagnosis not present

## 2017-09-01 DIAGNOSIS — I10 Essential (primary) hypertension: Secondary | ICD-10-CM | POA: Diagnosis not present

## 2017-09-10 DIAGNOSIS — R7301 Impaired fasting glucose: Secondary | ICD-10-CM | POA: Diagnosis not present

## 2017-10-09 ENCOUNTER — Encounter: Payer: 59 | Attending: Family Medicine | Admitting: Registered"

## 2017-10-09 DIAGNOSIS — R7303 Prediabetes: Secondary | ICD-10-CM | POA: Insufficient documentation

## 2017-10-09 DIAGNOSIS — Z713 Dietary counseling and surveillance: Secondary | ICD-10-CM | POA: Diagnosis not present

## 2017-10-09 NOTE — Progress Notes (Signed)
Medical Nutrition Therapy:  Appt start time: 0900 end time:  1000.  Assessment:  Primary concerns today:  Pt states she is concerned about her elevated blood sugar and has enrolled in a prediabetes year-long program in her community. Per referral her A1c was 6.6% 09/10/17.  Pt states she works 4 pm - midnight and will eat snacks during her work ours. For 30 days has been timing her last meal so she has 16-17 hrs of fasting while she sleeps to balance blood sugar.  Sleep: 6-7 hours per fit bit, wakes refreshed and gets chores done before work. Stress: recently increased, 2 adult children moved in, totaled vehicles, feels she needs to postpone retirement another year. Pt states she is dealing with it though.  Preferred Learning Style:   No preference indicated   Learning Readiness:   Ready  MEDICATIONS: reviewed. Pt states she doesn't like taking medicine. A few of the supplements she takes are vitamin B12 to stay regulate her mood. For acid reflux, Fenugreek, and for blood sugar, berberine   DIETARY INTAKE:  Usual eating pattern includes 2 meals and 2 snacks per day.  24-hr recall:  B (8:30-9 AM): 2 eggs, peppers, ham cheese, activia, water, 4 oz OJ  Snk ( AM): none OR fruit or nuts  L ( PM): wheat spaghetti,meat sauce, wheat crackers, salad, 6 oz lemonade Snk ( PM): none D ( PM): veggies with ranch, banana w PB,  Snk ( PM): homemade trail mix, oatmeal peacans, choc chips Beverages: 1/2 can mountain dew once a week  Usual physical activity: 13K steps a night at work, Zumba 2x week 20 min.  Estimated energy needs: 1800 calories 2 00 g carbohydrates 113 g protein 60 g fat  Progress Towards Goal(s):  In progress.   Nutritional Diagnosis:  NB-1.1 Food and nutrition-related knowledge deficit As related to sources of carbodrates.  As evidenced by pt reported belief that she was helping BG control by eating lots of fruit.    Intervention:  Nutrition education for managing blood  glucose with diet and lifestyle changes. Described diabetes. Stated some common risk factors for diabetes.  Defined the role of glucose and insulin.  Identified type of diabetes and pathophysiology.   Described the role of different macronutrients on glucose.  Explained how carbohydrates affect blood glucose.  Stated what foods contain the most carbohydrates.  Demonstrated carbohydrate counting.  Demonstrated how to read Nutrition Facts food label.  Plan: Aim to eat balanced meals and snacks, carbohydrates + protein + (vegetables often) fiber Keep eating nuts daily Zumba is a great thing to include in your life! Fairlife Yup milk may be a product you can digest You can check your blood sugar to see if your changes are making a difference in your blood sugar control  Teaching Method Utilized:  Visual Auditory  Handouts given during visit include:  Living Well With Diabetes  Snack sheet  Carb counting  A1c  Barriers to learning/adherence to lifestyle change: none  Demonstrated degree of understanding via:  Teach Back   Monitoring/Evaluation:  Dietary intake, exercise,  and body weight prn.

## 2017-10-09 NOTE — Patient Instructions (Signed)
Aim to eat balanced meals and snacks, carbohydrates + protein + (vegetables often) fiber Keep eating nuts daily Zumba is a great thing to include in your life! Fairlife Yup milk may be a product you can digest You can check your blood sugar to see if your changes are making a difference in your blood sugar control

## 2017-10-10 DIAGNOSIS — R7303 Prediabetes: Secondary | ICD-10-CM | POA: Insufficient documentation

## 2017-11-20 DIAGNOSIS — I10 Essential (primary) hypertension: Secondary | ICD-10-CM | POA: Diagnosis not present

## 2017-11-20 DIAGNOSIS — E119 Type 2 diabetes mellitus without complications: Secondary | ICD-10-CM | POA: Diagnosis not present

## 2017-11-20 DIAGNOSIS — E7800 Pure hypercholesterolemia, unspecified: Secondary | ICD-10-CM | POA: Diagnosis not present

## 2018-02-06 DIAGNOSIS — Z6824 Body mass index (BMI) 24.0-24.9, adult: Secondary | ICD-10-CM | POA: Diagnosis not present

## 2018-02-06 DIAGNOSIS — Z01419 Encounter for gynecological examination (general) (routine) without abnormal findings: Secondary | ICD-10-CM | POA: Diagnosis not present

## 2018-02-17 DIAGNOSIS — E039 Hypothyroidism, unspecified: Secondary | ICD-10-CM | POA: Diagnosis not present

## 2018-02-17 DIAGNOSIS — E7800 Pure hypercholesterolemia, unspecified: Secondary | ICD-10-CM | POA: Diagnosis not present

## 2018-02-17 DIAGNOSIS — E119 Type 2 diabetes mellitus without complications: Secondary | ICD-10-CM | POA: Diagnosis not present

## 2018-02-24 DIAGNOSIS — E7800 Pure hypercholesterolemia, unspecified: Secondary | ICD-10-CM | POA: Diagnosis not present

## 2018-02-24 DIAGNOSIS — I10 Essential (primary) hypertension: Secondary | ICD-10-CM | POA: Diagnosis not present

## 2018-02-24 DIAGNOSIS — E119 Type 2 diabetes mellitus without complications: Secondary | ICD-10-CM | POA: Diagnosis not present

## 2018-06-23 DIAGNOSIS — E039 Hypothyroidism, unspecified: Secondary | ICD-10-CM | POA: Diagnosis not present

## 2018-06-23 DIAGNOSIS — E119 Type 2 diabetes mellitus without complications: Secondary | ICD-10-CM | POA: Diagnosis not present

## 2018-06-26 DIAGNOSIS — E7800 Pure hypercholesterolemia, unspecified: Secondary | ICD-10-CM | POA: Diagnosis not present

## 2018-06-26 DIAGNOSIS — I10 Essential (primary) hypertension: Secondary | ICD-10-CM | POA: Diagnosis not present

## 2018-06-26 DIAGNOSIS — E119 Type 2 diabetes mellitus without complications: Secondary | ICD-10-CM | POA: Diagnosis not present

## 2018-09-23 DIAGNOSIS — Z0000 Encounter for general adult medical examination without abnormal findings: Secondary | ICD-10-CM | POA: Diagnosis not present

## 2018-09-28 DIAGNOSIS — I10 Essential (primary) hypertension: Secondary | ICD-10-CM | POA: Diagnosis not present

## 2018-09-29 DIAGNOSIS — E119 Type 2 diabetes mellitus without complications: Secondary | ICD-10-CM | POA: Diagnosis not present

## 2018-09-29 DIAGNOSIS — E7800 Pure hypercholesterolemia, unspecified: Secondary | ICD-10-CM | POA: Diagnosis not present

## 2018-09-29 DIAGNOSIS — I10 Essential (primary) hypertension: Secondary | ICD-10-CM | POA: Diagnosis not present

## 2018-10-04 ENCOUNTER — Emergency Department (HOSPITAL_COMMUNITY): Payer: 59

## 2018-10-04 ENCOUNTER — Encounter (HOSPITAL_COMMUNITY): Payer: Self-pay | Admitting: Emergency Medicine

## 2018-10-04 ENCOUNTER — Emergency Department (HOSPITAL_COMMUNITY)
Admission: EM | Admit: 2018-10-04 | Discharge: 2018-10-04 | Disposition: A | Discharge status: Home / Self Care | Payer: 59 | Attending: Emergency Medicine | Admitting: Emergency Medicine

## 2018-10-04 ENCOUNTER — Other Ambulatory Visit: Payer: Self-pay

## 2018-10-04 DIAGNOSIS — I10 Essential (primary) hypertension: Secondary | ICD-10-CM | POA: Diagnosis not present

## 2018-10-04 DIAGNOSIS — M79674 Pain in right toe(s): Secondary | ICD-10-CM | POA: Diagnosis not present

## 2018-10-04 DIAGNOSIS — M109 Gout, unspecified: Secondary | ICD-10-CM

## 2018-10-04 DIAGNOSIS — M10071 Idiopathic gout, right ankle and foot: Secondary | ICD-10-CM | POA: Diagnosis not present

## 2018-10-04 DIAGNOSIS — M79671 Pain in right foot: Secondary | ICD-10-CM

## 2018-10-04 DIAGNOSIS — Z79899 Other long term (current) drug therapy: Secondary | ICD-10-CM | POA: Insufficient documentation

## 2018-10-04 DIAGNOSIS — E039 Hypothyroidism, unspecified: Secondary | ICD-10-CM | POA: Diagnosis not present

## 2018-10-04 LAB — I-STAT CHEM 8, ED
BUN: 12 mg/dL (ref 8–23)
CALCIUM ION: 1.17 mmol/L (ref 1.15–1.40)
Chloride: 93 mmol/L — ABNORMAL LOW (ref 98–111)
Creatinine, Ser: 0.9 mg/dL (ref 0.44–1.00)
GLUCOSE: 140 mg/dL — AB (ref 70–99)
HCT: 44 % (ref 36.0–46.0)
HEMOGLOBIN: 15 g/dL (ref 12.0–15.0)
POTASSIUM: 3.1 mmol/L — AB (ref 3.5–5.1)
SODIUM: 132 mmol/L — AB (ref 135–145)
TCO2: 27 mmol/L (ref 22–32)

## 2018-10-04 MED ORDER — PREDNISONE 20 MG PO TABS: 40.0000 mg | ORAL_TABLET | Freq: Every day | ORAL | 0 refills | Status: DC

## 2018-10-04 MED ORDER — TRAMADOL HCL 50 MG PO TABS: 50.0000 mg | ORAL_TABLET | Freq: Four times a day (QID) | ORAL | 0 refills | Status: DC | PRN

## 2018-10-04 MED ORDER — COLCHICINE 0.6 MG PO TABS: 0.6000 mg | ORAL_TABLET | Freq: Every day | ORAL | 0 refills | Status: DC

## 2018-10-04 NOTE — Discharge Instructions (Signed)
Talk to your doctor about potentially changing the hydrochlorothiazide.  Also your potassium was mildly low and will need to be followed.

## 2018-10-04 NOTE — ED Triage Notes (Addendum)
Pt c/o R foot pain since last night. Pt states pain worsens with walking. Pt also states she hasn't feel well in general over the past 2 days and hasn't felt like eating. Several days ago pt's bp med was changed from Lisinopril to HCTZ.

## 2018-10-04 NOTE — ED Provider Notes (Addendum)
[facility]-EMERGENCY DEPT Provider Note   CSN: [id number] Arrival date & time: 10/04/18  7829     History   Chief Complaint Chief Complaint  Patient presents with  . Foot Pain    R    HPI Maria [name] is a 62 y.o. female.  HPI Patient presents with right foot pain.  Has had since last night.  Worse with walking.  Is on the bottom of the front part of her foot.  No fevers.  States she is felt kind of bad over the last couple weeks.  Has had a recent change to hydrochlorothiazide from lisinopril.  No fevers.  No weight loss.  No trauma.  No history of gout. Past Medical History:  Diagnosis Date  . GERD (gastroesophageal reflux disease)   . Hyperlipidemia   . Hypertension   . Hypothyroidism   . Pulmonary sarcoidosis (HCC)   . Sarcoidosis   . Viral URI     Patient Active Problem List   Diagnosis Date Noted  . Pre-diabetes 10/10/2017  . Acute URI 05/13/2011  . HYPERLIPIDEMIA 09/18/2010  . DYSPNEA 08/07/2010  . HYPERTENSION 07/21/2009  . GERD 06/28/2008  . SARCOIDOSIS, PULMONARY 06/21/2008  . Acute upper respiratory infection 06/21/2008    Past Surgical History:  Procedure Laterality Date  . ABDOMINAL HYSTERECTOMY    . BUNIONECTOMY Bilateral   . COLONOSCOPY  2007  . mediastinal node biopsy     dx with sarcoid  . MINOR CARPAL TUNNEL Right 03/01/2016   Procedure: RIGHT CARPAL TUNNEL RELEASE;  Surgeon: Dominica Severin, MD;  Location: [facility];  Service: Orthopedics;  Laterality: Right;     OB History   None      Home Medications    Prior to Admission medications   Medication Sig Start Date End Date Taking? Authorizing Provider  hydrochlorothiazide (HYDRODIURIL) 25 MG tablet Take 25 mg by mouth daily.    Yes [provider]  levothyroxine (SYNTHROID, LEVOTHROID) 75 MCG tablet Take 75 mcg by mouth daily.    Yes [provider]  rosuvastatin (CRESTOR) 10 MG tablet Take 10 mg by mouth daily.   Yes  [provider]  colchicine 0.6 MG tablet Take 1 tablet (0.6 mg total) by mouth daily. 10/04/18   Benjiman Core, MD  predniSONE (DELTASONE) 20 MG tablet Take 2 tablets (40 mg total) by mouth daily. 10/04/18   Benjiman Core, MD  traMADol (ULTRAM) 50 MG tablet Take 1 tablet (50 mg total) by mouth every 6 (six) hours as needed for moderate pain. 10/04/18   Benjiman Core, MD    Family History Family History  Problem Relation Age of Onset  . Heart disease Mother   . Heart disease Father   . Breast cancer Sister   . Colon cancer Neg Hx   . Esophageal cancer Neg Hx   . Rectal cancer Neg Hx   . Stomach cancer Neg Hx     Social History Social History   Tobacco Use  . Smoking status: Never Smoker  . Smokeless tobacco: Never Used  Substance Use Topics  . Alcohol use: No  . Drug use: No     Allergies   Penicillins   Review of Systems Review of Systems  Constitutional: Negative for appetite change.  Respiratory: Negative for shortness of breath.   Cardiovascular: Negative for chest pain and leg swelling.  Gastrointestinal: Negative for abdominal pain.  Genitourinary: Negative for flank pain.  Musculoskeletal: Negative for back pain.  Pain in right foot.  Skin: Negative for wound.  Neurological: Negative for weakness.     Physical Exam Updated Vital Signs BP 134/82 (BP Location: Left Arm)   Pulse (!) 105   Temp 98.4 F (36.9 C) (Oral)   Resp 16   SpO2 100%   Physical Exam  Constitutional: She appears well-developed.  Eyes: No scleral icterus.  Cardiovascular:  Mild tachycardia  Abdominal: Soft. There is no tenderness.  Musculoskeletal: She exhibits no edema.  Tenderness over right second MTP joint.  No erythema.  No other tenderness on the foot.  Pain with movement at that joint.  Scar from previous bunion surgery on first toe.  Neurological: She is alert.  Skin: Skin is warm. Capillary refill takes less than 2 seconds.     ED  Treatments / Results  Labs (all labs ordered are listed, but only abnormal results are displayed) Labs Reviewed  I-STAT CHEM 8, ED - Abnormal; Notable for the following components:      Result Value   Sodium 132 (*)    Potassium 3.1 (*)    Chloride 93 (*)    Glucose, Bld 140 (*)    All other components within normal limits    EKG None  Radiology Dg Toe 2nd Right  Result Date: 10/04/2018 CLINICAL DATA:  MTP joint pain. EXAM: RIGHT SECOND TOE COMPARISON:  None FINDINGS: There is no evidence of fracture or dislocation. There is no evidence of arthropathy or other focal bone abnormality. Soft tissues are unremarkable. IMPRESSION: Negative. Electronically Signed   By: Signa Kell M.D.   On: 10/04/2018 11:35    Procedures Procedures (including critical care time)  Medications Ordered in ED Medications - No data to display   Initial Impression / Assessment and Plan / ED Course  I have reviewed the triage vital signs and the nursing notes.  Pertinent labs & imaging results that were available during my care of the patient were reviewed by me and considered in my medical decision making (see chart for details).     Patient with foot pain.  Over first MTP joint.  X-ray reassuring.  Potentially gout.  Developed after switching to a diuretic.  Patient will need to discuss with PCP about the hydrochlorothiazide.  Given symptomatic treatment including steroids pain medicine and colchicine.  Discharge home. Mild hypokalemia can also be followed as an outpatient.  Final Clinical Impressions(s) / ED Diagnoses   Final diagnoses:  Foot pain, right  Acute gout of right foot, unspecified cause    ED Discharge Orders         Ordered    colchicine 0.6 MG tablet  Daily     10/04/18 1235    predniSONE (DELTASONE) 20 MG tablet  Daily     10/04/18 1235    traMADol (ULTRAM) 50 MG tablet  Every 6 hours PRN     10/04/18 1237           Benjiman Core, MD 10/04/18 1245      Benjiman Core, MD 10/04/18 1245

## 2018-10-06 DIAGNOSIS — I10 Essential (primary) hypertension: Secondary | ICD-10-CM | POA: Diagnosis not present

## 2018-10-06 DIAGNOSIS — M79671 Pain in right foot: Secondary | ICD-10-CM | POA: Diagnosis not present

## 2018-10-06 DIAGNOSIS — E876 Hypokalemia: Secondary | ICD-10-CM | POA: Diagnosis not present

## 2018-10-20 DIAGNOSIS — M79671 Pain in right foot: Secondary | ICD-10-CM | POA: Diagnosis not present

## 2018-10-20 DIAGNOSIS — E876 Hypokalemia: Secondary | ICD-10-CM | POA: Diagnosis not present

## 2018-12-29 DIAGNOSIS — E119 Type 2 diabetes mellitus without complications: Secondary | ICD-10-CM | POA: Diagnosis not present

## 2018-12-29 DIAGNOSIS — E7800 Pure hypercholesterolemia, unspecified: Secondary | ICD-10-CM | POA: Diagnosis not present

## 2018-12-29 DIAGNOSIS — E559 Vitamin D deficiency, unspecified: Secondary | ICD-10-CM | POA: Diagnosis not present

## 2018-12-29 DIAGNOSIS — I10 Essential (primary) hypertension: Secondary | ICD-10-CM | POA: Diagnosis not present

## 2018-12-29 DIAGNOSIS — E039 Hypothyroidism, unspecified: Secondary | ICD-10-CM | POA: Diagnosis not present

## 2019-10-25 ENCOUNTER — Telehealth: Payer: Self-pay | Admitting: Internal Medicine

## 2019-10-25 NOTE — Telephone Encounter (Signed)
Spoke with pt, she states when she eats fried, greasy food she has vomiting and diarrhea. She states she has started taking nexium bur she is still having these problems. Discussed with pt that she should avoid the fried, greasy foods, take the nexium 61min prior to breakfast, and take Imodium for diarrhea. Pt states she has been taking the Imodium but now she will try eliminating the greasy foods from her diet. Pt has already scheduled and OV.

## 2019-11-30 ENCOUNTER — Other Ambulatory Visit: Payer: Self-pay

## 2019-11-30 ENCOUNTER — Ambulatory Visit: Payer: Self-pay | Admitting: Internal Medicine

## 2019-11-30 ENCOUNTER — Encounter: Payer: Self-pay | Admitting: Internal Medicine

## 2019-11-30 VITALS — BP 154/90 | HR 88 | Temp 98.3°F | Ht 62.5 in | Wt 139.0 lb

## 2019-11-30 DIAGNOSIS — Z1159 Encounter for screening for other viral diseases: Secondary | ICD-10-CM

## 2019-11-30 DIAGNOSIS — R112 Nausea with vomiting, unspecified: Secondary | ICD-10-CM

## 2019-11-30 DIAGNOSIS — R1010 Upper abdominal pain, unspecified: Secondary | ICD-10-CM

## 2019-11-30 NOTE — Progress Notes (Signed)
HISTORY OF PRESENT ILLNESS:  Maria [name] is a 63 y.o. female with possible GERD who established care in this office September 02, 2011 regarding possible GERD as well as gas with abdominal bloating.  See that dictation.  She subsequently underwent follow-up screening colonoscopy August 23, 2016.  Examination was normal except for sigmoid diverticulosis.  Follow-up in 10 years recommended.  Patient presents now with a chief complaint of vomiting after greasy meals.  This is new.  She tells me that she underwent a 30-day fast in September.  She does this periodically.  No change in weight.  Since that time have been 3 episodes of vomiting her evening meal while sleeping in the middle of the night.  She described these meals as greasy.  Last episode was approximately 1 month ago.  She is also noticed a sensation of left upper quadrant pain associated with these vomiting episodes.  She vomits undigested food.  She is not diabetic.  No pain medications.  She denies having had any illness prior to the onset of symptoms such as a "gastroenteritis".  No vomiting or other complaints.  Her weight is been stable.  She does have occasional indigestion for which she takes Prilosec OTC 20 mg daily.  No new medications.  No other complaints.  Review of outside x-rays and laboratory shows no relevant abnormalities.  REVIEW OF SYSTEMS:  All non-GI ROS negative unless otherwise stated in the HPI.  Past Medical History:  Diagnosis Date  . GERD (gastroesophageal reflux disease)   . Hyperlipidemia   . Hypertension   . Hypothyroidism   . Pulmonary sarcoidosis (HCC)   . Sarcoidosis   . Viral URI     Past Surgical History:  Procedure Laterality Date  . ABDOMINAL HYSTERECTOMY    . BUNIONECTOMY Bilateral   . COLONOSCOPY  2007  . mediastinal node biopsy     dx with sarcoid  . MINOR CARPAL TUNNEL Right 03/01/2016   Procedure: RIGHT CARPAL TUNNEL RELEASE;  Surgeon: Dominica Severin, MD;  Location: [facility];  Service: Orthopedics;  Laterality: Right;    Social History Maria [name]  reports that she has never smoked. She has never used smokeless tobacco. She reports that she does not drink alcohol or use drugs.  family history includes Breast cancer in her sister; Heart disease in her father and mother.  Allergies  Allergen Reactions  . Penicillins Rash    Has patient had a PCN reaction causing immediate rash, facial/tongue/throat swelling, SOB or lightheadedness with hypotension: No Has patient had a PCN reaction causing severe rash involving mucus membranes or skin necrosis: No Has patient had a PCN reaction that required hospitalization: No Has patient had a PCN reaction occurring within the last 10 years: No If all of the above answers are "NO", then may proceed with Cephalosporin use.        PHYSICAL EXAMINATION: Vital signs: BP (!) 154/90   Pulse 88   Temp 98.3 F (36.8 C)   Ht 5' 2.5" (1.588 m)   Wt 139 lb (63 kg)   BMI 25.02 kg/m   Constitutional: generally well-appearing, no acute distress Psychiatric: alert and oriented x3, cooperative Eyes: extraocular movements intact, anicteric, conjunctiva pink Mouth: oral pharynx moist, no lesions Neck: supple no lymphadenopathy Cardiovascular: heart regular rate and rhythm, no murmur Lungs: clear to auscultation bilaterally Abdomen: soft, nontender, nondistended, no obvious ascites, no peritoneal signs, normal bowel sounds, no organomegaly Rectal: Omitted Extremities: no clubbing, cyanosis, or lower extremity edema bilaterally  Skin: no lesions on visible extremities Neuro: No focal deficits.  Cranial nerves intact  ASSESSMENT:  1.  Episodic vomiting at night hours after meals.  Associated left upper quadrant pain.  Question gastroparesis.  Question reflux.  Question mechanical obstruction 2.  History of GERD for which she is on PPI 3.  Colon cancer screening.  Up-to-date  PLAN:  1.  Schedule upper  endoscopy.The nature of the procedure, as well as the risks, benefits, and alternatives were carefully and thoroughly reviewed with the patient. Ample time for discussion and questions allowed. The patient understood, was satisfied, and agreed to proceed. 2.  Schedule abdominal ultrasound. 3.  If the above unrevealing then schedule solid-phase gastric emptying scan to rule out gastroparesis 4.  Routine screening colonoscopy around 2027

## 2019-11-30 NOTE — Patient Instructions (Signed)
You have been scheduled for an abdominal ultrasound at [facility]iology (1st floor of hospital) on 12/03/2019 at 11:00am. Please arrive 15 minutes prior to your appointment for registration. Make certain not to have anything to eat or drink 6 hours prior to your appointment. Should you need to reschedule your appointment, please contact radiology at [phone]. This test typically takes about 30 minutes to perform.  You have been scheduled for an endoscopy. Please follow written instructions given to you at your visit today. If you use inhalers (even only as needed), please bring them with you on the day of your procedure.

## 2019-12-03 ENCOUNTER — Ambulatory Visit (HOSPITAL_COMMUNITY)
Admission: RE | Admit: 2019-12-03 | Discharge: 2019-12-03 | Disposition: A | Discharge status: Home / Self Care | Payer: Self-pay | Source: Ambulatory Visit | Attending: Internal Medicine | Admitting: Internal Medicine

## 2019-12-03 ENCOUNTER — Other Ambulatory Visit: Payer: Self-pay

## 2019-12-03 DIAGNOSIS — Z1159 Encounter for screening for other viral diseases: Secondary | ICD-10-CM | POA: Insufficient documentation

## 2019-12-03 DIAGNOSIS — R112 Nausea with vomiting, unspecified: Secondary | ICD-10-CM | POA: Insufficient documentation

## 2019-12-03 DIAGNOSIS — R1010 Upper abdominal pain, unspecified: Secondary | ICD-10-CM | POA: Insufficient documentation

## 2019-12-06 ENCOUNTER — Telehealth: Payer: Self-pay | Admitting: Internal Medicine

## 2019-12-06 NOTE — Telephone Encounter (Signed)
Maria Shipper, MD  Algernon Huxley, RN  Please let the patient know that her ultrasound is unremarkable. Keep plans for EGD as already scheduled   The patient has been notified of this information and all questions answered.

## 2019-12-08 ENCOUNTER — Other Ambulatory Visit: Payer: Self-pay | Admitting: Internal Medicine

## 2019-12-08 ENCOUNTER — Ambulatory Visit (INDEPENDENT_AMBULATORY_CARE_PROVIDER_SITE_OTHER): Payer: Self-pay

## 2019-12-08 DIAGNOSIS — Z1159 Encounter for screening for other viral diseases: Secondary | ICD-10-CM

## 2019-12-09 LAB — SARS CORONAVIRUS 2 (TAT 6-24 HRS): SARS Coronavirus 2: NEGATIVE

## 2019-12-10 ENCOUNTER — Encounter: Payer: Self-pay | Admitting: Internal Medicine

## 2019-12-10 ENCOUNTER — Other Ambulatory Visit: Payer: Self-pay

## 2019-12-10 ENCOUNTER — Ambulatory Visit (AMBULATORY_SURGERY_CENTER): Payer: Self-pay | Admitting: Internal Medicine

## 2019-12-10 VITALS — BP 135/83 | HR 100 | Temp 98.3°F | Resp 34 | Ht 62.5 in | Wt 139.0 lb

## 2019-12-10 DIAGNOSIS — R1010 Upper abdominal pain, unspecified: Secondary | ICD-10-CM

## 2019-12-10 DIAGNOSIS — R112 Nausea with vomiting, unspecified: Secondary | ICD-10-CM

## 2019-12-10 MED ORDER — SODIUM CHLORIDE 0.9 % IV SOLN: 500.0000 mL | INTRAVENOUS | Status: DC

## 2019-12-10 NOTE — Op Note (Signed)
[facility] Patient Name: Maria [name] Procedure Date: 12/10/2019 9:14 AM MRN: [id number] Endoscopist: Wilhemina Bonito. Marina Goodell , MD Age: 63 Referring MD:  Date of Birth: [birth year] Gender: Female Account #: [id number] Procedure:                Upper GI endoscopy Indications:              Abdominal pain in the left upper quadrant, Nausea                            with vomiting Medicines:                Monitored Anesthesia Care Procedure:                Pre-Anesthesia Assessment:                           - Prior to the procedure, a History and Physical                            was performed, and patient medications and                            allergies were reviewed. The patient's tolerance of                            previous anesthesia was also reviewed. The risks                            and benefits of the procedure and the sedation                            options and risks were discussed with the patient.                            All questions were answered, and informed consent                            was obtained. Prior Anticoagulants: The patient has                            taken no previous anticoagulant or antiplatelet                            agents. ASA Grade Assessment: II - A patient with                            mild systemic disease. After reviewing the risks                            and benefits, the patient was deemed in                            satisfactory condition to undergo the procedure.  After obtaining informed consent, the endoscope was                            passed under direct vision. Throughout the                            procedure, the patient's blood pressure, pulse, and                            oxygen saturations were monitored continuously. The                            Endoscope was introduced through the mouth, and                            advanced to the second part of  duodenum. The upper                            GI endoscopy was accomplished without difficulty.                            The patient tolerated the procedure well. Scope In: Scope Out: Findings:                 The esophagus was normal.                           The stomach was normal.                           The examined duodenum was normal.                           The cardia and gastric fundus were normal on                            retroflexion. Complications:            No immediate complications. Estimated Blood Loss:     Estimated blood loss: none. Impression:               1. Normal EGD                           2. Episodic vomiting and left upper quadrant pain                            of undetermined cause. Recommendation:           1. Continue current medications                           2. Resume previous diet                           3. Schedule SOLID PHASE GASTRIC EMPTYING SCAN "rule  out gastroparesis".                           4. May consider CT imaging pending the outcome of                            the above study [employer] N. Henrene Pastor, MD 12/10/2019 9:30:01 AM This report has been signed electronically.

## 2019-12-10 NOTE — Progress Notes (Signed)
A and O x3. Report to RN. Tolerated MAC anesthesia well.Teeth unchanged after procedure.

## 2019-12-10 NOTE — Progress Notes (Signed)
Maria [name] Checked in, [place], [place] IV, Temp=LC, IV Vaughan Basta Hunt,RN  Pt's states no medical or surgical changes since office visit.

## 2019-12-10 NOTE — Patient Instructions (Signed)
Continue present medications and resume previous diet.  Schedule SOLID PHASE GASTRIC EMPTYING SCAN to rule out gastroparesis.  YOU HAD AN ENDOSCOPIC PROCEDURE TODAY AT [facility]:   Refer to the procedure report that was given to you for any specific questions about what was found during the examination.  If the procedure report does not answer your questions, please call your gastroenterologist to clarify.  If you requested that your care partner not be given the details of your procedure findings, then the procedure report has been included in a sealed envelope for you to review at your convenience later.  YOU SHOULD EXPECT: Some feelings of bloating in the abdomen. Passage of more gas than usual.  Walking can help get rid of the air that was put into your GI tract during the procedure and reduce the bloating. If you had a lower endoscopy (such as a colonoscopy or flexible sigmoidoscopy) you may notice spotting of blood in your stool or on the toilet paper. If you underwent a bowel prep for your procedure, you may not have a normal bowel movement for a few days.  Please Note:  You might notice some irritation and congestion in your nose or some drainage.  This is from the oxygen used during your procedure.  There is no need for concern and it should clear up in a day or so.  SYMPTOMS TO REPORT IMMEDIATELY:     Following upper endoscopy (EGD)  Vomiting of blood or coffee ground material  New chest pain or pain under the shoulder blades  Painful or persistently difficult swallowing  New shortness of breath  Fever of 100F or higher  Black, tarry-looking stools  For urgent or emergent issues, a gastroenterologist can be reached at any hour by calling [phone].   DIET:  We do recommend a small meal at first, but then you may proceed to your regular diet.  Drink plenty of fluids but you should avoid alcoholic beverages for 24 hours.  ACTIVITY:  You should plan to take  it easy for the rest of today and you should NOT DRIVE or use heavy machinery until tomorrow (because of the sedation medicines used during the test).    FOLLOW UP: Our staff will call the number listed on your records 48-72 hours following your procedure to check on you and address any questions or concerns that you may have regarding the information given to you following your procedure. If we do not reach you, we will leave a message.  We will attempt to reach you two times.  During this call, we will ask if you have developed any symptoms of COVID 19. If you develop any symptoms (ie: fever, flu-like symptoms, shortness of breath, cough etc.) before then, please call [phone].  If you test positive for Covid 19 in the 2 weeks post procedure, please call and report this information to [place].    If any biopsies were taken you will be contacted by phone or by letter within the next 1-3 weeks.  Please call us at [phone] if you have not heard about the biopsies in 3 weeks.    SIGNATURES/CONFIDENTIALITY: You and/or your care partner have signed paperwork which will be entered into your electronic medical record.  These signatures attest to the fact that that the information above on your After Visit Summary has been reviewed and is understood.  Full responsibility of the confidentiality of this discharge information lies with you and/or your care-partner.

## 2019-12-13 ENCOUNTER — Other Ambulatory Visit: Payer: Self-pay

## 2019-12-13 ENCOUNTER — Telehealth: Payer: Self-pay

## 2019-12-13 DIAGNOSIS — R112 Nausea with vomiting, unspecified: Secondary | ICD-10-CM

## 2019-12-13 DIAGNOSIS — R1010 Upper abdominal pain, unspecified: Secondary | ICD-10-CM

## 2019-12-13 NOTE — Telephone Encounter (Signed)
Pt scheduled for GES at [facility] 12/22/19@7 :30am, pt to arrive there at 7:15am, scan at 7:30am. Pt to be NPO after midnight and hold omeprazole for at least 8 hours prior to scan. Pt aware of appt and instructions.

## 2019-12-14 ENCOUNTER — Telehealth: Payer: Self-pay

## 2019-12-14 ENCOUNTER — Telehealth: Payer: Self-pay | Admitting: *Deleted

## 2019-12-14 NOTE — Telephone Encounter (Signed)
  Follow up Call-  Call back number 12/10/2019  Post procedure Call Back phone  # [phone]  Permission to leave phone message Yes  Some recent data might be hidden     Patient questions:  Do you have a fever, pain , or abdominal swelling? No. Pain Score  0 *  Have you tolerated food without any problems? Yes.    Have you been able to return to your normal activities? Yes.    Do you have any questions about your discharge instructions: Diet   No. Medications  No. Follow up visit  No.  Do you have questions or concerns about your Care? No.  Actions: * If pain score is 4 or above: No action needed, pain <4.  1. Have you developed a fever since your procedure? no  2.   Have you had an respiratory symptoms (SOB or cough) since your procedure? no  3.   Have you tested positive for COVID 19 since your procedure no  4.   Have you had any family members/close contacts diagnosed with the COVID 19 since your procedure?  no   If yes to any of these questions please route to Joylene John, RN and Alphonsa Gin, Therapist, sports.

## 2019-12-14 NOTE — Telephone Encounter (Signed)
Called [phone] and left a message we tried to reach pt for a follow up call. maw

## 2019-12-22 ENCOUNTER — Other Ambulatory Visit: Payer: Self-pay

## 2019-12-22 ENCOUNTER — Ambulatory Visit (HOSPITAL_COMMUNITY)
Admission: RE | Admit: 2019-12-22 | Discharge: 2019-12-22 | Disposition: A | Discharge status: Home / Self Care | Payer: Self-pay | Source: Ambulatory Visit | Attending: Internal Medicine | Admitting: Internal Medicine

## 2019-12-22 ENCOUNTER — Encounter (HOSPITAL_COMMUNITY): Payer: Self-pay

## 2019-12-22 DIAGNOSIS — R1010 Upper abdominal pain, unspecified: Secondary | ICD-10-CM | POA: Insufficient documentation

## 2019-12-22 DIAGNOSIS — R112 Nausea with vomiting, unspecified: Secondary | ICD-10-CM | POA: Insufficient documentation

## 2019-12-22 MED ORDER — TECHNETIUM TC 99M SULFUR COLLOID
2.1000 | Freq: Once | INTRAVENOUS | Status: AC | PRN
  Administered 2019-12-22: 2.1 via INTRAVENOUS

## 2020-02-07 ENCOUNTER — Encounter: Payer: Self-pay | Admitting: Internal Medicine

## 2020-02-07 ENCOUNTER — Ambulatory Visit (INDEPENDENT_AMBULATORY_CARE_PROVIDER_SITE_OTHER): Payer: 59 | Admitting: Internal Medicine

## 2020-02-07 VITALS — BP 118/80 | HR 64 | Temp 97.6°F | Ht 62.5 in | Wt 136.1 lb

## 2020-02-07 DIAGNOSIS — R112 Nausea with vomiting, unspecified: Secondary | ICD-10-CM | POA: Diagnosis not present

## 2020-02-07 DIAGNOSIS — K3184 Gastroparesis: Secondary | ICD-10-CM | POA: Diagnosis not present

## 2020-02-07 DIAGNOSIS — R1010 Upper abdominal pain, unspecified: Secondary | ICD-10-CM

## 2020-02-07 MED ORDER — METOCLOPRAMIDE HCL 10 MG PO TABS: ORAL_TABLET | ORAL | 6 refills | Status: DC

## 2020-02-07 NOTE — Patient Instructions (Signed)
We have sent the following medications to your pharmacy for you to pick up at your convenience:  Reglan  

## 2020-02-07 NOTE — Progress Notes (Signed)
HISTORY OF PRESENT ILLNESS:  Maria [name] is a 64 y.o. female who was evaluated November 30, 2019 regarding episodic vomiting at night and upper abdominal pain.  See that dictation for details.  Upper endoscopy was performed and returned normal.  Abdominal ultrasound was unremarkable.  Solid-phase gastric emptying scan revealed mild gastroparesis at hours 3 and 4.  Gastroparesis diet recommended.  Follow-up at this time arranged.  The patient states that she is doing better with her diet.  No further abdominal pain.  No further vomiting episodes though she did notice nausea or queasiness after consuming a hot dog.  She wonders if there is any medication she might take when she notices these symptoms periodically.  She is lost about 3 pounds since her last visit but overall is feeling well.  She is up-to-date with surveillance colonoscopy.  REVIEW OF SYSTEMS:  All non-GI ROS negative less otherwise stated in the HPI.  Past Medical History:  Diagnosis Date  . GERD (gastroesophageal reflux disease)   . Hyperlipidemia   . Hypertension   . Hypothyroidism   . Pulmonary sarcoidosis ([place])   . Sarcoidosis   . Viral URI     Past Surgical History:  Procedure Laterality Date  . ABDOMINAL HYSTERECTOMY    . BUNIONECTOMY Bilateral   . COLONOSCOPY  2007  . mediastinal node biopsy     dx with sarcoid  . MINOR CARPAL TUNNEL Right 03/01/2016   Procedure: RIGHT CARPAL TUNNEL RELEASE;  Surgeon: Roseanne Kaufman, MD;  Location: [place];  Service: Orthopedics;  Laterality: Right;    Social History Meral Geissinger  reports that she has never smoked. She has never used smokeless tobacco. She reports that she does not drink alcohol or use drugs.  family history includes Breast cancer in her sister; Heart disease in her father and mother.  Allergies  Allergen Reactions  . Penicillins Rash    Has patient had a PCN reaction causing immediate rash, facial/tongue/throat swelling, SOB  or lightheadedness with hypotension: No Has patient had a PCN reaction causing severe rash involving mucus membranes or skin necrosis: No Has patient had a PCN reaction that required hospitalization: No Has patient had a PCN reaction occurring within the last 10 years: No If all of the above answers are "NO", then may proceed with Cephalosporin use.        PHYSICAL EXAMINATION: Vital signs: BP 118/80   Pulse 64   Temp 97.6 F (36.4 C)   Ht 5' 2.5" (1.588 m)   Wt 136 lb 2 oz (61.7 kg)   BMI 24.50 kg/m   Constitutional: generally well-appearing, no acute distress Psychiatric: alert and oriented x3, cooperative Eyes: extraocular movements intact, anicteric, conjunctiva pink Mouth: Mask Neck: supple no lymphadenopathy Cardiovascular: heart regular rate and rhythm, no murmur Lungs: clear to auscultation bilaterally Abdomen: soft, nontender, nondistended, no obvious ascites, no peritoneal signs, normal bowel sounds, no organomegaly.  No succussion splash Rectal: Omitted Extremities: no clubbing, cyanosis, or lower extremity edema bilaterally Skin: no lesions on visible extremities Neuro: No focal deficits.  Cranial nerves intact  ASSESSMENT:  1.  Gastroparesis.  Symptoms of pain, nausea, and vomiting improved with dietary measures.  Occasional queasiness as noted 2.  Colonoscopy 2017 with diverticulosis  PLAN:  1.  Continue gastroparesis diet 2.  Prescribed Reglan 10 mg every 6 hours as needed.  Medication effects, side effects, and risks reviewed 3.  Surveillance colonoscopy around 2027 4.  Routine GI office follow-up 6 months.  Contact the office  in the interim for any questions or problems A total time of 30 minutes was spent preparing to see the patient.  Reviewing test and procedure results, obtaining history, performing relevant physical exam, counseling the patient regarding her gastroparesis.  Ordering medication and recommended follow-up.  Finally, documenting clinical  information in the health record

## 2021-03-22 ENCOUNTER — Ambulatory Visit (INDEPENDENT_AMBULATORY_CARE_PROVIDER_SITE_OTHER): Payer: Medicare Other | Admitting: Plastic Surgery

## 2021-03-22 ENCOUNTER — Other Ambulatory Visit: Payer: Self-pay

## 2021-03-22 ENCOUNTER — Encounter: Payer: Self-pay | Admitting: Plastic Surgery

## 2021-03-22 VITALS — BP 146/90 | HR 96 | Ht 62.0 in | Wt 136.0 lb

## 2021-03-22 DIAGNOSIS — Q831 Accessory breast: Secondary | ICD-10-CM | POA: Diagnosis not present

## 2021-03-22 NOTE — Progress Notes (Signed)
Referring Provider Lupita Raider, MD [address] [place],  [place] [address]   CC:  Chief Complaint  Patient presents with  . Advice Only      Maria [name] is an 65 y.o. female.  HPI: Patient presents complaining of bilateral excess axillary soft tissue.  Is been present for years.  She feels like is growing in size.  The prominence will catch in her bra make it difficult for her to rest her arms at her side.  She is interested in having them removed.  They are intermittently tender.  Allergies  Allergen Reactions  . Penicillins Rash    Has patient had a PCN reaction causing immediate rash, facial/tongue/throat swelling, SOB or lightheadedness with hypotension: No Has patient had a PCN reaction causing severe rash involving mucus membranes or skin necrosis: No Has patient had a PCN reaction that required hospitalization: No Has patient had a PCN reaction occurring within the last 10 years: No If all of the above answers are "NO", then may proceed with Cephalosporin use.     Outpatient Encounter Medications as of 03/22/2021  Medication Sig  . calcium carbonate (TUMS EX) 750 MG chewable tablet Chew 1 tablet by mouth as needed for heartburn.  . Cholecalciferol (VITAMIN D3) 25 MCG (1000 UT) CAPS Take 2 capsules by mouth daily.  . hydrochlorothiazide (HYDRODIURIL) 12.5 MG tablet Take 12.5 mg by mouth daily.   Marland Kitchen levothyroxine (SYNTHROID) 50 MCG tablet Take 1 tablet by mouth. Daily, except 2 tablets on Saturday and Sunday  . loratadine (CLARITIN) 10 MG tablet Take 10 mg by mouth daily as needed for allergies.  Marland Kitchen metoCLOPramide (REGLAN) 10 MG tablet Take one tablet every 6 hours as needed for delayed gastric emptying  . omeprazole (PRILOSEC OTC) 20 MG tablet Take 20 mg by mouth as needed.  . Probiotic Product (PROBIOTIC-10 PO) Take 1 tablet by mouth daily.  . rosuvastatin (CRESTOR) 10 MG tablet Take 10 mg by mouth daily.   No facility-administered encounter  medications on file as of 03/22/2021.     Past Medical History:  Diagnosis Date  . GERD (gastroesophageal reflux disease)   . Hyperlipidemia   . Hypertension   . Hypothyroidism   . Pulmonary sarcoidosis (HCC)   . Sarcoidosis   . Viral URI     Past Surgical History:  Procedure Laterality Date  . ABDOMINAL HYSTERECTOMY    . BUNIONECTOMY Bilateral   . COLONOSCOPY  2007  . mediastinal node biopsy     dx with sarcoid  . MINOR CARPAL TUNNEL Right 03/01/2016   Procedure: RIGHT CARPAL TUNNEL RELEASE;  Surgeon: Dominica Severin, MD;  Location: [facility];  Service: Orthopedics;  Laterality: Right;    Family History  Problem Relation Age of Onset  . Heart disease Mother   . Heart disease Father   . Breast cancer Sister   . Colon cancer Neg Hx   . Esophageal cancer Neg Hx   . Rectal cancer Neg Hx   . Stomach cancer Neg Hx   . Colon polyps Neg Hx     Social History   Social History Narrative  . Not on file     Review of Systems General: Denies fevers, chills, weight loss CV: Denies chest pain, shortness of breath, palpitations  Physical Exam Vitals with BMI 03/22/2021 02/07/2020 12/10/2019  Height 5\' 2"  5' 2.5" -  Weight 136 lbs 136 lbs 2 oz -  BMI 24.87 24.49 -  Systolic 146 118  Diastolic 90 80 83  Pulse 96 64 100    General:  No acute distress,  Alert and oriented, Non-Toxic, Normal speech and affect Examination of the axilla shows fairly symmetric excess soft tissue that is folding over right at the preaxillary line.  There is mild tenderness but no overlying skin changes.  This appears consistent with bilateral accessory axillary breast tissue.  Assessment/Plan Patient presents with what looks like symptomatic bilateral accessory axillary breast tissue.  We discussed excision.  We discussed the risks include bleeding, infection, damage to surrounding structures and need for additional procedures.  All her questions were answered and we will plan to  move forward for approval.  Allena Napoleon 03/22/2021, 9:12 AM

## 2021-03-22 NOTE — Addendum Note (Signed)
Addended by: Verdie Shire on: 03/22/2021 12:59 PM   Modules accepted: Orders

## 2021-04-24 ENCOUNTER — Ambulatory Visit (INDEPENDENT_AMBULATORY_CARE_PROVIDER_SITE_OTHER): Payer: Medicare Other | Admitting: Surgical

## 2021-04-24 ENCOUNTER — Encounter: Payer: Self-pay | Admitting: Surgical

## 2021-04-24 ENCOUNTER — Other Ambulatory Visit: Payer: Self-pay

## 2021-04-24 VITALS — BP 165/88 | HR 88 | Ht 62.5 in | Wt 136.0 lb

## 2021-04-24 DIAGNOSIS — Q831 Accessory breast: Secondary | ICD-10-CM

## 2021-04-24 MED ORDER — HYDROCODONE-ACETAMINOPHEN 5-325 MG PO TABS: 1.0000 | ORAL_TABLET | Freq: Four times a day (QID) | ORAL | 0 refills | Status: AC | PRN

## 2021-04-24 MED ORDER — ONDANSETRON HCL 4 MG PO TABS: 4.0000 mg | ORAL_TABLET | Freq: Three times a day (TID) | ORAL | 0 refills | Status: DC | PRN

## 2021-04-24 NOTE — H&P (View-Only) (Signed)
Patient ID: Maria [name], female    DOB: December 18, 1956, 65 y.o.   MRN: [id number]  Chief Complaint  Patient presents with  . Pre-op Exam      ICD-10-CM   1. Axillary accessory breast tissue  Q83.1     History of Present Illness: Maria [name] is a 65 y.o.  female  with a history of excess bilateral axillary breast tissue.  She presents for preoperative evaluation for upcoming procedure, excision of bilateral accessory axillary breast tissue, scheduled for 05/15/2021 with Dr. Arita Miss.  The patient has not had problems with anesthesia. No history of DVT/PE.  No family history of DVT/PE.  No family or personal history of bleeding or clotting disorders.  Patient is not currently taking any blood thinners.  No history of CVA/MI.   PMH Significant for: Hypothyroidism, hyperlipidemia, hypertension, pulmonary sarcoidosis, GERD   Past Medical History: Allergies: Allergies  Allergen Reactions  . Penicillins Rash    Has patient had a PCN reaction causing immediate rash, facial/tongue/throat swelling, SOB or lightheadedness with hypotension: No Has patient had a PCN reaction causing severe rash involving mucus membranes or skin necrosis: No Has patient had a PCN reaction that required hospitalization: No Has patient had a PCN reaction occurring within the last 10 years: No If all of the above answers are "NO", then may proceed with Cephalosporin use.     Current Medications:  Current Outpatient Medications:  .  hydrochlorothiazide (HYDRODIURIL) 12.5 MG tablet, Take 12.5 mg by mouth daily. , Disp: , Rfl:  .  levothyroxine (SYNTHROID) 50 MCG tablet, Take 1 tablet by mouth. Daily, except 2 tablets on Saturday and Sunday, Disp: , Rfl:  .  loratadine (CLARITIN) 10 MG tablet, Take 10 mg by mouth daily as needed for allergies., Disp: , Rfl:  .  omeprazole (PRILOSEC OTC) 20 MG tablet, Take 20 mg by mouth as needed., Disp: , Rfl:  .  rosuvastatin (CRESTOR) 10 MG tablet, Take 10 mg by  mouth daily., Disp: , Rfl:   Past Medical Problems: Past Medical History:  Diagnosis Date  . GERD (gastroesophageal reflux disease)   . Hyperlipidemia   . Hypertension   . Hypothyroidism   . Pulmonary sarcoidosis (HCC)   . Sarcoidosis   . Viral URI     Past Surgical History: Past Surgical History:  Procedure Laterality Date  . ABDOMINAL HYSTERECTOMY    . BUNIONECTOMY Bilateral   . COLONOSCOPY  2007  . mediastinal node biopsy     dx with sarcoid  . MINOR CARPAL TUNNEL Right 03/01/2016   Procedure: RIGHT CARPAL TUNNEL RELEASE;  Surgeon: Dominica Severin, MD;  Location: [facility];  Service: Orthopedics;  Laterality: Right;    Social History: Social History   Socioeconomic History  . Marital status: Divorced    Spouse name: Not on file  . Number of children: 2  . Years of education: Not on file  . Highest education level: Not on file  Occupational History  . 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 Smoking status: Never Smoker  . Smokeless tobacco: Never Used  Vaping Use  . Vaping Use: Never used  Substance and Sexual Activity  . Alcohol use: No  . Drug use: No  . Sexual activity: Not on file  Other Topics Concern  . Not on file  Social History Narrative  . Not on file   Social Determinants of Health   Financial Resource Strain: Not on file  Food Insecurity: Not on file  Transportation Needs: Not on file  Physical Activity: Not on file  Stress: Not on file  Social Connections: Not on file  Intimate Partner Violence: Not on file    Family History: Family History  Problem Relation Age of Onset  . Heart disease Mother   . Heart disease Father   . Breast cancer Sister   . Colon cancer Neg Hx   . Esophageal cancer Neg Hx   . Rectal cancer Neg Hx   . Stomach cancer Neg Hx   . Colon polyps Neg Hx     Review of Systems: Review of Systems  Constitutional: Negative.   Respiratory: Negative.    Cardiovascular: Negative.   Gastrointestinal: Negative.   Neurological: Negative.     Physical Exam: Vital Signs BP (!) 165/88 (BP Location: Left Arm, Patient Position: Sitting, Cuff Size: Normal)   Pulse 88   Ht 5' 2.5" (1.588 m)   Wt 136 lb (61.7 kg)   SpO2 98%   BMI 24.48 kg/m   Physical Exam Constitutional:      General: Not in acute distress.    Appearance: Normal appearance. Not ill-appearing.  HENT:     Head: Normocephalic and atraumatic.  Eyes:     Pupils: Pupils are equal, round Neck:     Musculoskeletal: Normal range of motion.  Cardiovascular:     Rate and Rhythm: Normal rate    Pulses: Normal pulses.  Pulmonary:     Effort: Pulmonary effort is normal. No respiratory distress.  Abdominal:     General: Abdomen is flat. There is no distension.  Musculoskeletal: Normal range of motion.  Skin:    General: Skin is warm and dry.     Findings: No erythema or rash.  Neurological:     General: No focal deficit present.     Mental Status: Alert and oriented to person, place, and time. Mental status is at baseline.     Motor: No weakness.  Psychiatric:        Mood and Affect: Mood normal.        Behavior: Behavior normal.    Assessment/Plan: The patient is scheduled for excision of bilateral accessory axillary breast tissue with Dr. Pace.  Risks, benefits, and alternatives of procedure discussed, questions answered and consent obtained.    Smoking Status: Non-smoker; Counseling Given?  N/A Last Mammogram: Patient reports negative mammogram December 2021; she is going to have the results faxed to our office  Caprini Score: 4, moderate; Risk Factors include: Age and length of planned surgery. Recommendation for mechanical prophylaxis. Encourage early ambulation.   Pictures obtained:@[email or web address]  Post-op Rx sent to pharmacy: Norco, Zofran  Patient was provided with the General Surgical Risk consent document and Pain Medication Agreement prior to their appointment.   They had adequate time to read through the risk consent documents and Pain Medication Agreement. We also discussed them in person together during this preop appointment. All of their questions were answered to their satisfaction.  Recommended calling if they have any further questions.  Risk consent form and Pain Medication Agreement to be scanned into patient's chart.  We discussed the specific risks associated with excision of excess axillary breast tissue.  We discussed the risks including but not limited to bleeding, infection, damage to surrounding structures, wounds, need for additional operations, DVT/PE, sensory changes.  Electronically signed by: Kelty Szafran J Yassen Kinnett, PA-C 04/24/2021 3:08 PM 

## 2021-04-24 NOTE — Progress Notes (Signed)
Patient ID: Maria [name], female    DOB: December 18, 1956, 65 y.o.   MRN: [id number]  Chief Complaint  Patient presents with  . Pre-op Exam      ICD-10-CM   1. Axillary accessory breast tissue  Q83.1     History of Present Illness: Maria [name] is a 65 y.o.  female  with a history of excess bilateral axillary breast tissue.  She presents for preoperative evaluation for upcoming procedure, excision of bilateral accessory axillary breast tissue, scheduled for 05/15/2021 with Dr. Arita Miss.  The patient has not had problems with anesthesia. No history of DVT/PE.  No family history of DVT/PE.  No family or personal history of bleeding or clotting disorders.  Patient is not currently taking any blood thinners.  No history of CVA/MI.   PMH Significant for: Hypothyroidism, hyperlipidemia, hypertension, pulmonary sarcoidosis, GERD   Past Medical History: Allergies: Allergies  Allergen Reactions  . Penicillins Rash    Has patient had a PCN reaction causing immediate rash, facial/tongue/throat swelling, SOB or lightheadedness with hypotension: No Has patient had a PCN reaction causing severe rash involving mucus membranes or skin necrosis: No Has patient had a PCN reaction that required hospitalization: No Has patient had a PCN reaction occurring within the last 10 years: No If all of the above answers are "NO", then may proceed with Cephalosporin use.     Current Medications:  Current Outpatient Medications:  .  hydrochlorothiazide (HYDRODIURIL) 12.5 MG tablet, Take 12.5 mg by mouth daily. , Disp: , Rfl:  .  levothyroxine (SYNTHROID) 50 MCG tablet, Take 1 tablet by mouth. Daily, except 2 tablets on Saturday and Sunday, Disp: , Rfl:  .  loratadine (CLARITIN) 10 MG tablet, Take 10 mg by mouth daily as needed for allergies., Disp: , Rfl:  .  omeprazole (PRILOSEC OTC) 20 MG tablet, Take 20 mg by mouth as needed., Disp: , Rfl:  .  rosuvastatin (CRESTOR) 10 MG tablet, Take 10 mg by  mouth daily., Disp: , Rfl:   Past Medical Problems: Past Medical History:  Diagnosis Date  . GERD (gastroesophageal reflux disease)   . Hyperlipidemia   . Hypertension   . Hypothyroidism   . Pulmonary sarcoidosis (HCC)   . Sarcoidosis   . Viral URI     Past Surgical History: Past Surgical History:  Procedure Laterality Date  . ABDOMINAL HYSTERECTOMY    . BUNIONECTOMY Bilateral   . COLONOSCOPY  2007  . mediastinal node biopsy     dx with sarcoid  . MINOR CARPAL TUNNEL Right 03/01/2016   Procedure: RIGHT CARPAL TUNNEL RELEASE;  Surgeon: Dominica Severin, MD;  Location: [facility];  Service: Orthopedics;  Laterality: Right;    Social History: Social History   Socioeconomic History  . Marital status: Divorced    Spouse name: Not on file  . Number of children: 2  . Years of education: Not on file  . Highest education level: Not on file  Occupational History  . 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 Smoking status: Never Smoker  . Smokeless tobacco: Never Used  Vaping Use  . Vaping Use: Never used  Substance and Sexual Activity  . Alcohol use: No  . Drug use: No  . Sexual activity: Not on file  Other Topics Concern  . Not on file  Social History Narrative  . Not on file   Social Determinants of Health   Financial Resource Strain: Not on file  Food Insecurity: Not on file  Transportation Needs: Not on file  Physical Activity: Not on file  Stress: Not on file  Social Connections: Not on file  Intimate Partner Violence: Not on file    Family History: Family History  Problem Relation Age of Onset  . Heart disease Mother   . Heart disease Father   . Breast cancer Sister   . Colon cancer Neg Hx   . Esophageal cancer Neg Hx   . Rectal cancer Neg Hx   . Stomach cancer Neg Hx   . Colon polyps Neg Hx     Review of Systems: Review of Systems  Constitutional: Negative.   Respiratory: Negative.    Cardiovascular: Negative.   Gastrointestinal: Negative.   Neurological: Negative.     Physical Exam: Vital Signs BP (!) 165/88 (BP Location: Left Arm, Patient Position: Sitting, Cuff Size: Normal)   Pulse 88   Ht 5' 2.5" (1.588 m)   Wt 136 lb (61.7 kg)   SpO2 98%   BMI 24.48 kg/m   Physical Exam Constitutional:      General: Not in acute distress.    Appearance: Normal appearance. Not ill-appearing.  HENT:     Head: Normocephalic and atraumatic.  Eyes:     Pupils: Pupils are equal, round Neck:     Musculoskeletal: Normal range of motion.  Cardiovascular:     Rate and Rhythm: Normal rate    Pulses: Normal pulses.  Pulmonary:     Effort: Pulmonary effort is normal. No respiratory distress.  Abdominal:     General: Abdomen is flat. There is no distension.  Musculoskeletal: Normal range of motion.  Skin:    General: Skin is warm and dry.     Findings: No erythema or rash.  Neurological:     General: No focal deficit present.     Mental Status: Alert and oriented to person, place, and time. Mental status is at baseline.     Motor: No weakness.  Psychiatric:        Mood and Affect: Mood normal.        Behavior: Behavior normal.    Assessment/Plan: The patient is scheduled for excision of bilateral accessory axillary breast tissue with Dr. Arita Miss.  Risks, benefits, and alternatives of procedure discussed, questions answered and consent obtained.    Smoking Status: Non-smoker; Counseling Given?  N/A Last Mammogram: Patient reports negative mammogram December 2021; she is going to have the results faxed to our office  Caprini Score: 4, moderate; Risk Factors include: Age and length of planned surgery. Recommendation for mechanical prophylaxis. Encourage early ambulation.   Pictures obtained:@Consult   Post-op Rx sent to pharmacy: Norco, Zofran  Patient was provided with the General Surgical Risk consent document and Pain Medication Agreement prior to their appointment.   They had adequate time to read through the risk consent documents and Pain Medication Agreement. We also discussed them in person together during this preop appointment. All of their questions were answered to their satisfaction.  Recommended calling if they have any further questions.  Risk consent form and Pain Medication Agreement to be scanned into patient's chart.  We discussed the specific risks associated with excision of excess axillary breast tissue.  We discussed the risks including but not limited to bleeding, infection, damage to surrounding structures, wounds, need for additional operations, DVT/PE, sensory changes.  Electronically signed by: Kermit Balo Gunnar Hereford, PA-C 04/24/2021 3:08 PM

## 2021-05-08 ENCOUNTER — Other Ambulatory Visit: Payer: Self-pay

## 2021-05-08 ENCOUNTER — Encounter (HOSPITAL_BASED_OUTPATIENT_CLINIC_OR_DEPARTMENT_OTHER): Payer: Self-pay | Admitting: Plastic Surgery

## 2021-05-09 ENCOUNTER — Encounter (HOSPITAL_BASED_OUTPATIENT_CLINIC_OR_DEPARTMENT_OTHER)
Admission: RE | Admit: 2021-05-09 | Discharge: 2021-05-09 | Disposition: A | Discharge status: Home / Self Care | Payer: Medicare Other | Source: Ambulatory Visit | Attending: Plastic Surgery | Admitting: Plastic Surgery

## 2021-05-09 DIAGNOSIS — Z01812 Encounter for preprocedural laboratory examination: Secondary | ICD-10-CM | POA: Diagnosis present

## 2021-05-09 LAB — BASIC METABOLIC PANEL
Anion gap: 9 (ref 5–15)
BUN: 16 mg/dL (ref 8–23)
CO2: 28 mmol/L (ref 22–32)
Calcium: 9.9 mg/dL (ref 8.9–10.3)
Chloride: 102 mmol/L (ref 98–111)
Creatinine, Ser: 0.79 mg/dL (ref 0.44–1.00)
GFR, Estimated: >60 mL/min (ref >60)
Glucose, Bld: 100 mg/dL — ABNORMAL HIGH (ref 70–99)
Potassium: 3.4 mmol/L — ABNORMAL LOW (ref 3.5–5.1)
Sodium: 139 mmol/L (ref 135–145)

## 2021-05-11 ENCOUNTER — Other Ambulatory Visit (HOSPITAL_COMMUNITY)
Admission: RE | Admit: 2021-05-11 | Discharge: 2021-05-11 | Disposition: A | Discharge status: Home / Self Care | Payer: Medicare Other | Source: Ambulatory Visit | Attending: Plastic Surgery | Admitting: Plastic Surgery

## 2021-05-11 DIAGNOSIS — Z01812 Encounter for preprocedural laboratory examination: Secondary | ICD-10-CM | POA: Insufficient documentation

## 2021-05-11 DIAGNOSIS — Z20822 Contact with and (suspected) exposure to covid-19: Secondary | ICD-10-CM | POA: Insufficient documentation

## 2021-05-11 LAB — SARS CORONAVIRUS 2 (TAT 6-24 HRS): SARS Coronavirus 2: NEGATIVE

## 2021-05-14 NOTE — Anesthesia Preprocedure Evaluation (Addendum)
Anesthesia Evaluation  Patient identified by MRN, date of birth, ID band Patient awake    Reviewed: Allergy & Precautions, NPO status , Patient's Chart, lab work & pertinent test results  History of Anesthesia Complications Negative for: history of anesthetic complications  Airway Mallampati: II  TM Distance: >3 FB Neck ROM: Full    Dental no notable dental hx. (+) Dental Advisory Given   Pulmonary neg pulmonary ROS,  Pulmonary Sarcoid   Pulmonary exam normal        Cardiovascular hypertension, Pt. on medications Normal cardiovascular exam     Neuro/Psych negative neurological ROS     GI/Hepatic Neg liver ROS, GERD  Medicated,  Endo/Other  Hypothyroidism   Renal/GU negative Renal ROS     Musculoskeletal negative musculoskeletal ROS (+)   Abdominal   Peds  Hematology negative hematology ROS (+)   Anesthesia Other Findings   Reproductive/Obstetrics                            Anesthesia Physical Anesthesia Plan  ASA: II  Anesthesia Plan: General   Post-op Pain Management:    Induction: Intravenous  PONV Risk Score and Plan: 4 or greater and Ondansetron, Dexamethasone, Scopolamine patch - Pre-op and Midazolam  Airway Management Planned: LMA  Additional Equipment:   Intra-op Plan:   Post-operative Plan: Extubation in OR  Informed Consent: I have reviewed the patients History and Physical, chart, labs and discussed the procedure including the risks, benefits and alternatives for the proposed anesthesia with the patient or authorized representative who has indicated his/her understanding and acceptance.     Dental advisory given  Plan Discussed with: Anesthesiologist and CRNA  Anesthesia Plan Comments:        Anesthesia Quick Evaluation

## 2021-05-15 ENCOUNTER — Ambulatory Visit (HOSPITAL_BASED_OUTPATIENT_CLINIC_OR_DEPARTMENT_OTHER): Payer: Medicare Other | Admitting: Anesthesiology

## 2021-05-15 ENCOUNTER — Other Ambulatory Visit: Payer: Self-pay

## 2021-05-15 ENCOUNTER — Encounter (HOSPITAL_BASED_OUTPATIENT_CLINIC_OR_DEPARTMENT_OTHER)
Admission: RE | Disposition: A | Discharge status: Home / Self Care | Payer: Self-pay | Source: Home / Self Care | Attending: Plastic Surgery

## 2021-05-15 ENCOUNTER — Ambulatory Visit (HOSPITAL_BASED_OUTPATIENT_CLINIC_OR_DEPARTMENT_OTHER)
Admission: RE | Admit: 2021-05-15 | Discharge: 2021-05-15 | Disposition: A | Discharge status: Home / Self Care | Payer: Medicare Other | Attending: Plastic Surgery | Admitting: Plastic Surgery

## 2021-05-15 ENCOUNTER — Encounter (HOSPITAL_BASED_OUTPATIENT_CLINIC_OR_DEPARTMENT_OTHER): Payer: Self-pay | Admitting: Plastic Surgery

## 2021-05-15 DIAGNOSIS — Z79899 Other long term (current) drug therapy: Secondary | ICD-10-CM | POA: Diagnosis not present

## 2021-05-15 DIAGNOSIS — Z880 Allergy status to penicillin: Secondary | ICD-10-CM | POA: Insufficient documentation

## 2021-05-15 DIAGNOSIS — Q831 Accessory breast: Secondary | ICD-10-CM | POA: Insufficient documentation

## 2021-05-15 DIAGNOSIS — Z79890 Hormone replacement therapy (postmenopausal): Secondary | ICD-10-CM | POA: Diagnosis not present

## 2021-05-15 HISTORY — PX: MASS EXCISION: SHX2000

## 2021-05-15 SURGERY — EXCISION MASS
Anesthesia: General | Site: Axilla | Laterality: Bilateral

## 2021-05-15 MED ORDER — ONDANSETRON HCL 4 MG/2ML IJ SOLN
INTRAMUSCULAR | Status: AC
  Filled 2021-05-15: qty 2

## 2021-05-15 MED ORDER — PROPOFOL 10 MG/ML IV BOLUS
INTRAVENOUS | Status: AC
  Filled 2021-05-15: qty 20

## 2021-05-15 MED ORDER — CLINDAMYCIN PHOSPHATE 900 MG/50ML IV SOLN
INTRAVENOUS | Status: AC
  Filled 2021-05-15: qty 50

## 2021-05-15 MED ORDER — MIDAZOLAM HCL 5 MG/5ML IJ SOLN
INTRAMUSCULAR | Status: DC | PRN
  Administered 2021-05-15: 2 mg via INTRAVENOUS

## 2021-05-15 MED ORDER — FENTANYL CITRATE (PF) 100 MCG/2ML IJ SOLN
INTRAMUSCULAR | Status: AC
  Filled 2021-05-15: qty 2

## 2021-05-15 MED ORDER — ACETAMINOPHEN 500 MG PO TABS
ORAL_TABLET | ORAL | Status: AC
  Filled 2021-05-15: qty 2

## 2021-05-15 MED ORDER — FENTANYL CITRATE (PF) 100 MCG/2ML IJ SOLN
INTRAMUSCULAR | Status: DC | PRN
  Administered 2021-05-15: 50 ug via INTRAVENOUS
  Administered 2021-05-15: 100 ug via INTRAVENOUS

## 2021-05-15 MED ORDER — EPINEPHRINE PF 1 MG/ML IJ SOLN
INTRAMUSCULAR | Status: AC
  Filled 2021-05-15: qty 1

## 2021-05-15 MED ORDER — CELECOXIB 200 MG PO CAPS
ORAL_CAPSULE | ORAL | Status: AC
  Filled 2021-05-15: qty 1

## 2021-05-15 MED ORDER — CELECOXIB 200 MG PO CAPS
200.0000 mg | ORAL_CAPSULE | Freq: Once | ORAL | Status: AC
  Administered 2021-05-15: 200 mg via ORAL

## 2021-05-15 MED ORDER — PROPOFOL 10 MG/ML IV BOLUS
INTRAVENOUS | Status: DC | PRN
  Administered 2021-05-15: 150 mg via INTRAVENOUS

## 2021-05-15 MED ORDER — LIDOCAINE HCL (CARDIAC) PF 100 MG/5ML IV SOSY
PREFILLED_SYRINGE | INTRAVENOUS | Status: DC | PRN
  Administered 2021-05-15: 100 mg via INTRAVENOUS

## 2021-05-15 MED ORDER — ACETAMINOPHEN 500 MG PO TABS
1000.0000 mg | ORAL_TABLET | Freq: Once | ORAL | Status: AC
  Administered 2021-05-15: 1000 mg via ORAL

## 2021-05-15 MED ORDER — LIDOCAINE 2% (20 MG/ML) 5 ML SYRINGE
INTRAMUSCULAR | Status: AC
  Filled 2021-05-15: qty 5

## 2021-05-15 MED ORDER — SCOPOLAMINE 1 MG/3DAYS TD PT72
MEDICATED_PATCH | TRANSDERMAL | Status: AC
  Filled 2021-05-15: qty 1

## 2021-05-15 MED ORDER — FENTANYL CITRATE (PF) 100 MCG/2ML IJ SOLN: 25.0000 ug | INTRAMUSCULAR | Status: DC | PRN

## 2021-05-15 MED ORDER — DEXAMETHASONE SODIUM PHOSPHATE 10 MG/ML IJ SOLN
INTRAMUSCULAR | Status: AC
  Filled 2021-05-15: qty 1

## 2021-05-15 MED ORDER — ONDANSETRON HCL 4 MG/2ML IJ SOLN
INTRAMUSCULAR | Status: DC | PRN
  Administered 2021-05-15: 4 mg via INTRAVENOUS

## 2021-05-15 MED ORDER — DEXAMETHASONE SODIUM PHOSPHATE 10 MG/ML IJ SOLN
INTRAMUSCULAR | Status: DC | PRN
  Administered 2021-05-15: 5 mg via INTRAVENOUS

## 2021-05-15 MED ORDER — MIDAZOLAM HCL 2 MG/2ML IJ SOLN
INTRAMUSCULAR | Status: AC
  Filled 2021-05-15: qty 2

## 2021-05-15 MED ORDER — SCOPOLAMINE 1 MG/3DAYS TD PT72
1.0000 | MEDICATED_PATCH | TRANSDERMAL | Status: DC
  Administered 2021-05-15: 1.5 mg via TRANSDERMAL

## 2021-05-15 MED ORDER — LACTATED RINGERS IV SOLN
INTRAVENOUS | Status: DC | PRN
  Administered 2021-05-15: 350 mL

## 2021-05-15 MED ORDER — LACTATED RINGERS IV SOLN: INTRAVENOUS | Status: DC

## 2021-05-15 MED ORDER — BUPIVACAINE HCL (PF) 0.25 % IJ SOLN
INTRAMUSCULAR | Status: AC
  Filled 2021-05-15: qty 30

## 2021-05-15 MED ORDER — CLINDAMYCIN PHOSPHATE 900 MG/50ML IV SOLN
900.0000 mg | INTRAVENOUS | Status: AC
  Administered 2021-05-15: 900 mg via INTRAVENOUS

## 2021-05-15 SURGICAL SUPPLY — 75 items
ADH SKN CLS APL DERMABOND .7 (GAUZE/BANDAGES/DRESSINGS)
APL PRP STRL LF DISP 70% ISPRP (MISCELLANEOUS) ×1
APL SKNCLS STERI-STRIP NONHPOA (GAUZE/BANDAGES/DRESSINGS) ×2
BAND INSRT 18 STRL LF DISP RB (MISCELLANEOUS)
BAND RUBBER #18 3X1/16 STRL (MISCELLANEOUS) IMPLANT
BENZOIN TINCTURE PRP APPL 2/3 (GAUZE/BANDAGES/DRESSINGS) ×4 IMPLANT
BINDER BREAST LRG (GAUZE/BANDAGES/DRESSINGS) ×2 IMPLANT
BLADE CLIPPER SURG (BLADE) IMPLANT
BLADE SURG 15 STRL LF DISP TIS (BLADE) ×1 IMPLANT
BLADE SURG 15 STRL SS (BLADE) ×2
CANISTER SUCT 1200ML W/VALVE (MISCELLANEOUS) ×2 IMPLANT
CHLORAPREP W/TINT 26 (MISCELLANEOUS) ×2 IMPLANT
COVER BACK TABLE 60X90IN (DRAPES) ×2 IMPLANT
COVER MAYO STAND STRL (DRAPES) ×2 IMPLANT
COVER WAND RF STERILE (DRAPES) IMPLANT
DERMABOND ADVANCED (GAUZE/BANDAGES/DRESSINGS)
DERMABOND ADVANCED .7 DNX12 (GAUZE/BANDAGES/DRESSINGS) IMPLANT
DRAIN JP 10F RND SILICONE (MISCELLANEOUS) IMPLANT
DRAPE LAPAROTOMY 100X72 PEDS (DRAPES) ×2 IMPLANT
DRAPE U-SHAPE 76X120 STRL (DRAPES) IMPLANT
DRAPE UTILITY XL STRL (DRAPES) ×2 IMPLANT
DRSG PAD ABDOMINAL 8X10 ST (GAUZE/BANDAGES/DRESSINGS) ×4 IMPLANT
DRSG TELFA 3X8 NADH (GAUZE/BANDAGES/DRESSINGS) IMPLANT
ELECT COATED BLADE 2.86 ST (ELECTRODE) IMPLANT
ELECT NEEDLE BLADE 2-5/6 (NEEDLE) IMPLANT
ELECT REM PT RETURN 9FT ADLT (ELECTROSURGICAL)
ELECT REM PT RETURN 9FT PED (ELECTROSURGICAL) ×2
ELECTRODE REM PT RETRN 9FT PED (ELECTROSURGICAL) ×1 IMPLANT
ELECTRODE REM PT RTRN 9FT ADLT (ELECTROSURGICAL) IMPLANT
EVACUATOR SILICONE 100CC (DRAIN) IMPLANT
GAUZE SPONGE 4X4 12PLY STRL LF (GAUZE/BANDAGES/DRESSINGS) IMPLANT
GAUZE XEROFORM 1X8 LF (GAUZE/BANDAGES/DRESSINGS) IMPLANT
GLOVE SRG 8 PF TXTR STRL LF DI (GLOVE) ×1 IMPLANT
GLOVE SURG ENC TEXT LTX SZ7.5 (GLOVE) ×2 IMPLANT
GLOVE SURG UNDER POLY LF SZ8 (GLOVE) ×2
GOWN STRL REUS W/ TWL LRG LVL3 (GOWN DISPOSABLE) ×2 IMPLANT
GOWN STRL REUS W/TWL LRG LVL3 (GOWN DISPOSABLE) ×4
NDL SAFETY ECLIPSE 18X1.5 (NEEDLE) IMPLANT
NEEDLE FILTER BLUNT 18X 1/2SAF (NEEDLE)
NEEDLE FILTER BLUNT 18X1 1/2 (NEEDLE) IMPLANT
NEEDLE HYPO 18GX1.5 SHARP (NEEDLE)
NEEDLE HYPO 30GX1 BEV (NEEDLE) IMPLANT
NEEDLE PRECISIONGLIDE 27X1.5 (NEEDLE) ×2 IMPLANT
NEEDLE SPNL 18GX3.5 QUINCKE PK (NEEDLE) ×2 IMPLANT
NS IRRIG 1000ML POUR BTL (IV SOLUTION) ×2 IMPLANT
PACK BASIN DAY SURGERY FS (CUSTOM PROCEDURE TRAY) ×2 IMPLANT
PENCIL SMOKE EVACUATOR (MISCELLANEOUS) ×2 IMPLANT
SHEET MEDIUM DRAPE 40X70 STRL (DRAPES) IMPLANT
SLEEVE SCD COMPRESS KNEE MED (STOCKING) ×2 IMPLANT
SPONGE GAUZE 2X2 8PLY STRL LF (GAUZE/BANDAGES/DRESSINGS) IMPLANT
SPONGE LAP 18X18 RF (DISPOSABLE) ×2 IMPLANT
STAPLER INSORB 30 2030 C-SECTI (MISCELLANEOUS) ×2 IMPLANT
STAPLER VISISTAT 35W (STAPLE) ×2 IMPLANT
STRIP CLOSURE SKIN 1/2X4 (GAUZE/BANDAGES/DRESSINGS) ×2 IMPLANT
SUCTION FRAZIER HANDLE 10FR (MISCELLANEOUS)
SUCTION TUBE FRAZIER 10FR DISP (MISCELLANEOUS) IMPLANT
SUT ETHILON 4 0 PS 2 18 (SUTURE) IMPLANT
SUT MNCRL AB 4-0 PS2 18 (SUTURE) IMPLANT
SUT MON AB 5-0 P3 18 (SUTURE) IMPLANT
SUT PDS 3-0 CT2 (SUTURE)
SUT PDS II 3-0 CT2 27 ABS (SUTURE) IMPLANT
SUT PLAIN 5 0 P 3 18 (SUTURE) IMPLANT
SUT PROLENE 5 0 P 3 (SUTURE) IMPLANT
SUT VICRYL 4-0 PS2 18IN ABS (SUTURE) IMPLANT
SUT VLOC 90 P-14 23 (SUTURE) ×4 IMPLANT
SWAB COLLECTION DEVICE MRSA (MISCELLANEOUS) IMPLANT
SWAB CULTURE ESWAB REG 1ML (MISCELLANEOUS) IMPLANT
SYR 50ML LL SCALE MARK (SYRINGE) IMPLANT
SYR BULB EAR ULCER 3OZ GRN STR (SYRINGE) IMPLANT
SYR BULB IRRIG 60ML STRL (SYRINGE) ×2 IMPLANT
SYR CONTROL 10ML LL (SYRINGE) IMPLANT
TOWEL GREEN STERILE FF (TOWEL DISPOSABLE) ×2 IMPLANT
TRAY DSU PREP LF (CUSTOM PROCEDURE TRAY) IMPLANT
TUBE CONNECTING 20X1/4 (TUBING) ×2 IMPLANT
TUBING INFILTRATION IT-10001 (TUBING) ×2 IMPLANT

## 2021-05-15 NOTE — Anesthesia Procedure Notes (Signed)
Procedure Name: LMA Insertion Performed by: Shia Eber S, CRNA Pre-anesthesia Checklist: Patient identified, Emergency Drugs available, Suction available and Patient being monitored Patient Re-evaluated:Patient Re-evaluated prior to induction Oxygen Delivery Method: Circle System Utilized Preoxygenation: Pre-oxygenation with 100% oxygen Induction Type: IV induction Ventilation: Mask ventilation without difficulty LMA: LMA inserted LMA Size: 4.0 Number of attempts: 1 Airway Equipment and Method: Bite block Placement Confirmation: positive ETCO2 Tube secured with: Tape Dental Injury: Teeth and Oropharynx as per pre-operative assessment        

## 2021-05-15 NOTE — Op Note (Signed)
Operative Note   DATE OF OPERATION: 05/15/2021  SURGICAL DEPARTMENT: Plastic Surgery  PREOPERATIVE DIAGNOSES: Bilateral accessory axillary breast tissue  POSTOPERATIVE DIAGNOSES:  same  PROCEDURE: 1.  Excision of bilateral accessory axillary breast tissue 2.  Bilateral axillary complex closure totaling 40 cm in length  SURGEON: Ancil Linsey, MD  ASSISTANT: Enedina Finner, RNFA The advanced practice practitioner (APP) assisted throughout the case.  The APP was essential in retraction and counter traction when needed to make the case progress smoothly.  This retraction and assistance made it possible to see the tissue plans for the procedure.  The assistance was needed for blood control, tissue re-approximation and assisted with closure of the incision site.  ANESTHESIA:  General.   COMPLICATIONS: None.   INDICATIONS FOR PROCEDURE:  The patient, Maria [name] is a 65 y.o. female born on 04-29-1956, is here for treatment of symptomatic bilateral accessory axillary breast tissue MRN: [id number]  CONSENT:  Informed consent was obtained directly from the patient. Risks, benefits and alternatives were fully discussed. Specific risks including but not limited to bleeding, infection, hematoma, seroma, scarring, pain, contracture, asymmetry, wound healing problems, and need for further surgery were all discussed. The patient did have an ample opportunity to have questions answered to satisfaction.   DESCRIPTION OF PROCEDURE:  The patient was taken to the operating room. SCDs were placed and antibiotics were given.  General anesthesia was administered.  The patient's operative site was prepped and draped in a sterile fashion. A time out was performed and all information was confirmed to be correct.  I started by planning out the excisions on both sides and marked an elliptical excision of the breast tissue in both axilla.  I then infiltrated both sides with 100 to 200 cc of tumescent  solution.  The breast tissue was then excised with a combination of 15 blade and cautery.  This was passed off the field as separate specimens.  Meticulous hemostasis was then obtained and circumferential undermining was performed on both sides to take tension off the closure and the skin was advanced and closed in layers with interrupted buried Enzor staplers and a running 3 OV lock.  This gave her great on table result with much improved contour.  Benzoin and Steri-Strips and a soft compressive dressing were then applied.  The length of the closure was 20 cm on each side for a total of 40 cm of closure.  The patient tolerated the procedure well.  There were no complications. The patient was allowed to wake from anesthesia, extubated and taken to the recovery room in satisfactory condition.

## 2021-05-15 NOTE — Anesthesia Postprocedure Evaluation (Signed)
Anesthesia Post Note  Patient: Maria [name]  Procedure(s) Performed: Excision of bilateral accessory axillary breast tissue (Bilateral Axilla)     Patient location during evaluation: PACU Anesthesia Type: General Level of consciousness: sedated Pain management: pain level controlled Vital Signs Assessment: post-procedure vital signs reviewed and stable Respiratory status: spontaneous breathing and respiratory function stable Cardiovascular status: stable Postop Assessment: no apparent nausea or vomiting Anesthetic complications: no   No complications documented.  Last Vitals:  Vitals:   05/15/21 0915 05/15/21 0936  BP: (!) 120/92 (!) 161/89  Pulse: 84 89  Resp: (!) 21 20  Temp:  36.6 C  SpO2: 98% 95%    Last Pain:  Vitals:   05/15/21 0929  TempSrc:   PainSc: 2                  Baya Lentz DANIEL

## 2021-05-15 NOTE — Discharge Instructions (Signed)
Activity As tolerated: NO showers for 2 days No heavy activities  Diet: Regular  Wound Care: Keep dressing clean & dry for 2 days.  Keep wrap applied with compression as much as possible.    Do not change dressings for 2 days unless soiled.  Can change if needed but make sure to reapply wrap. After two days can remove wrap and shower.  Then reapply dressings if needed and continue compression with wrap or soft sports bra. Call doctor if any unusual problems occur such as pain, excessive bleeding, unrelieved nausea/vomiting, fever &/or chills  Follow-up appointment: Scheduled for next week.  No tylenol until after 12:45pm today. No ibuprofen/motrin until after 2:45pm today.  Post Anesthesia Home Care Instructions  Activity: Get plenty of rest for the remainder of the day. A responsible individual must stay with you for 24 hours following the procedure.  For the next 24 hours, DO NOT: -Drive a car -Advertising copywriter -Drink alcoholic beverages -Take any medication unless instructed by your physician -Make any legal decisions or sign important papers.  Meals: Start with liquid foods such as gelatin or soup. Progress to regular foods as tolerated. Avoid greasy, spicy, heavy foods. If nausea and/or vomiting occur, drink only clear liquids until the nausea and/or vomiting subsides. Call your physician if vomiting continues.  Special Instructions/Symptoms: Your throat may feel dry or sore from the anesthesia or the breathing tube placed in your throat during surgery. If this causes discomfort, gargle with warm salt water. The discomfort should disappear within 24 hours.  If you had a scopolamine patch placed behind your ear for the management of post- operative nausea and/or vomiting:  1. The medication in the patch is effective for 72 hours, after which it should be removed.  Wrap patch in a tissue and discard in the trash. Wash hands thoroughly with soap and water. 2. You may remove  the patch earlier than 72 hours if you experience unpleasant side effects which may include dry mouth, dizziness or visual disturbances. 3. Avoid touching the patch. Wash your hands with soap and water after contact with the patch.

## 2021-05-15 NOTE — Brief Op Note (Signed)
05/15/2021  8:32 AM  PATIENT:  Maria [name]  65 y.o. female  PRE-OPERATIVE DIAGNOSIS:  Axillary Accessory Breast Tissue  POST-OPERATIVE DIAGNOSIS:  Axillary Accessory Breast Tissue  PROCEDURE:  Procedure(s) with comments: Excision of bilateral accessory axillary breast tissue (Bilateral) - 1 hour  SURGEON:  Surgeon(s) and Role:    * Ellyson Rarick, Wendy Poet, MD - Primary  PHYSICIAN ASSISTANT: Enedina Finner, RNFA  ASSISTANTS: none   ANESTHESIA:   general  EBL:  5 mL   BLOOD ADMINISTERED:none  DRAINS: none   LOCAL MEDICATIONS USED:  MARCAINE     SPECIMEN:  Source of Specimen:  r and l accessory breast tissue  DISPOSITION OF SPECIMEN:  PATHOLOGY  COUNTS:  YES  TOURNIQUET:  * No tourniquets in log *  DICTATION: .Dragon Dictation  PLAN OF CARE: Discharge to home after PACU  PATIENT DISPOSITION:  PACU - hemodynamically stable.   Delay start of Pharmacological VTE agent (>24hrs) due to surgical blood loss or risk of bleeding: not applicable

## 2021-05-15 NOTE — Transfer of Care (Signed)
Immediate Anesthesia Transfer of Care Note  Patient: Maria [name]  Procedure(s) Performed: Excision of bilateral accessory axillary breast tissue (Bilateral Axilla)  Patient Location: PACU  Anesthesia Type:General  Level of Consciousness: drowsy  Airway & Oxygen Therapy: Patient Spontanous Breathing and Patient connected to face mask oxygen  Post-op Assessment: Report given to RN and Post -op Vital signs reviewed and stable  Post vital signs: Reviewed and stable  Last Vitals:  Vitals Value Taken Time  BP    Temp    Pulse 86 05/15/21 0842  Resp 10 05/15/21 0842  SpO2 100 % 05/15/21 0842  Vitals shown include unvalidated device data.  Last Pain:  Vitals:   05/15/21 0633  TempSrc: Oral  PainSc: 0-No pain         Complications: No complications documented.

## 2021-05-15 NOTE — Interval H&P Note (Signed)
History and Physical Interval Note:  05/15/2021 7:12 AM  Maria [name]  has presented today for surgery, with the diagnosis of Axillary Accessory Breast Tissue.  The various methods of treatment have been discussed with the patient and family. After consideration of risks, benefits and other options for treatment, the patient has consented to  Procedure(s) with comments: Excision of bilateral accessory axillary breast tissue (Bilateral) - 1 hour as a surgical intervention.  The patient's history has been reviewed, patient examined, no change in status, stable for surgery.  I have reviewed the patient's chart and labs.  Questions were answered to the patient's satisfaction.     Allena Napoleon

## 2021-05-16 ENCOUNTER — Encounter (HOSPITAL_BASED_OUTPATIENT_CLINIC_OR_DEPARTMENT_OTHER): Payer: Self-pay | Admitting: Plastic Surgery

## 2021-05-16 LAB — SURGICAL PATHOLOGY

## 2021-05-23 ENCOUNTER — Ambulatory Visit (INDEPENDENT_AMBULATORY_CARE_PROVIDER_SITE_OTHER): Payer: Medicare Other | Admitting: Plastic Surgery

## 2021-05-23 ENCOUNTER — Other Ambulatory Visit: Payer: Self-pay

## 2021-05-23 DIAGNOSIS — Q831 Accessory breast: Secondary | ICD-10-CM

## 2021-05-23 NOTE — Progress Notes (Signed)
Patient presents about 1 week postop from excision of bilateral excess axillary breast tissue.  She is doing well with no complaints.  On exam everything is healing fine.  We will have her continue to avoid strenuous activity and follow-up in 1 month.  All of her questions were answered.

## 2021-07-04 ENCOUNTER — Other Ambulatory Visit: Payer: Self-pay

## 2021-07-04 ENCOUNTER — Ambulatory Visit (INDEPENDENT_AMBULATORY_CARE_PROVIDER_SITE_OTHER): Payer: Medicare Other | Admitting: Plastic Surgery

## 2021-07-04 DIAGNOSIS — Q831 Accessory breast: Secondary | ICD-10-CM

## 2021-07-04 NOTE — Progress Notes (Signed)
Patient presents about 6 weeks postop from excision of bilateral axillary excess breast tissue.  She is overall very happy.  Left side looks to be doing great.  On the right side which was bigger she does have a small dogear posteriorly which we will have to watch.  I will plan to see her back in 4 months or so to reevaluate that.  Otherwise things are going well.  All of her questions were answered.

## 2021-07-05 ENCOUNTER — Ambulatory Visit: Payer: Medicare Other | Admitting: Plastic Surgery

## 2021-11-07 ENCOUNTER — Ambulatory Visit (INDEPENDENT_AMBULATORY_CARE_PROVIDER_SITE_OTHER): Payer: Medicare Other | Admitting: Plastic Surgery

## 2021-11-07 ENCOUNTER — Other Ambulatory Visit: Payer: Self-pay

## 2021-11-07 DIAGNOSIS — Q831 Accessory breast: Secondary | ICD-10-CM | POA: Diagnosis not present

## 2021-11-07 NOTE — Progress Notes (Signed)
   Referring Provider Irena Reichmann, DO [address] [place],  [place] [address]   CC:  Chief Complaint  Patient presents with   Follow-up      Maria [name] is an 65 y.o. female.  HPI: Patient presents about 6 months out from excision of accessory axillary breast tissue.  She is overall very happy and feels like things are going great.  Review of Systems General: Denies fevers and chills  Physical Exam Vitals with BMI 05/15/2021 05/15/2021 05/15/2021  Height - - -  Weight - - -  BMI - - -  Systolic 161 120 498  Diastolic 89 92 77  Pulse 89 84 93    General:  No acute distress,  Alert and oriented, Non-Toxic, Normal speech and affect On exam her scar is healed nicely.  The contour is much better.  She has a small point to the posterior aspect of the scar on the right side which has improved significantly.  It does not bother her enough at this point to excise.  Otherwise everything looks great.  Assessment/Plan Patient presents 6 months postop from excision of accessory axillary breast tissue.  She feels good about her outcome.  I explained if she did reach a point in the future where she wanted me to excise the pointed posterior aspect of the scar I could also add do not expect this will be necessary as it will continue to soften over time.  She is very happy and we will see her again on an as-needed basis.  Allena Napoleon 11/07/2021, 9:17 AM

## 2022-02-19 ENCOUNTER — Emergency Department (HOSPITAL_COMMUNITY)
Admission: EM | Admit: 2022-02-19 | Discharge: 2022-02-19 | Disposition: A | Discharge status: Home / Self Care | Payer: Medicare Other | Attending: Emergency Medicine | Admitting: Emergency Medicine

## 2022-02-19 ENCOUNTER — Emergency Department (HOSPITAL_COMMUNITY): Payer: Medicare Other

## 2022-02-19 ENCOUNTER — Other Ambulatory Visit: Payer: Self-pay

## 2022-02-19 ENCOUNTER — Encounter (HOSPITAL_COMMUNITY): Payer: Self-pay | Admitting: Emergency Medicine

## 2022-02-19 DIAGNOSIS — R7982 Elevated C-reactive protein (CRP): Secondary | ICD-10-CM | POA: Insufficient documentation

## 2022-02-19 DIAGNOSIS — I10 Essential (primary) hypertension: Secondary | ICD-10-CM | POA: Insufficient documentation

## 2022-02-19 DIAGNOSIS — Z79899 Other long term (current) drug therapy: Secondary | ICD-10-CM | POA: Insufficient documentation

## 2022-02-19 DIAGNOSIS — H538 Other visual disturbances: Secondary | ICD-10-CM | POA: Diagnosis present

## 2022-02-19 DIAGNOSIS — H4710 Unspecified papilledema: Secondary | ICD-10-CM | POA: Insufficient documentation

## 2022-02-19 LAB — CBC WITH DIFFERENTIAL/PLATELET
Abs Immature Granulocytes: 0.02 10*3/uL (ref 0.00–0.07)
Basophils Absolute: 0.1 10*3/uL (ref 0.0–0.1)
Basophils Relative: 1 %
Eosinophils Absolute: 0.2 10*3/uL (ref 0.0–0.5)
Eosinophils Relative: 3 %
HCT: 43.7 % (ref 36.0–46.0)
Hemoglobin: 14 g/dL (ref 12.0–15.0)
Immature Granulocytes: 0 %
Lymphocytes Relative: 32 %
Lymphs Abs: 2.5 10*3/uL (ref 0.7–4.0)
MCH: 27.9 pg (ref 26.0–34.0)
MCHC: 32 g/dL (ref 30.0–36.0)
MCV: 87.2 fL (ref 80.0–100.0)
Monocytes Absolute: 0.6 10*3/uL (ref 0.1–1.0)
Monocytes Relative: 8 %
Neutro Abs: 4.4 10*3/uL (ref 1.7–7.7)
Neutrophils Relative %: 56 %
Platelets: 294 10*3/uL (ref 150–400)
RBC: 5.01 MIL/uL (ref 3.87–5.11)
RDW: 13.4 % (ref 11.5–15.5)
WBC: 7.8 10*3/uL (ref 4.0–10.5)
nRBC: 0 % (ref 0.0–0.2)

## 2022-02-19 LAB — BASIC METABOLIC PANEL
Anion gap: 12 (ref 5–15)
BUN: 12 mg/dL (ref 8–23)
CO2: 25 mmol/L (ref 22–32)
Calcium: 10.2 mg/dL (ref 8.9–10.3)
Chloride: 102 mmol/L (ref 98–111)
Creatinine, Ser: 0.79 mg/dL (ref 0.44–1.00)
GFR, Estimated: >60 mL/min (ref >60)
Glucose, Bld: 94 mg/dL (ref 70–99)
Potassium: 3.6 mmol/L (ref 3.5–5.1)
Sodium: 139 mmol/L (ref 135–145)

## 2022-02-19 LAB — C-REACTIVE PROTEIN: CRP: 2.8 mg/dL — ABNORMAL HIGH (ref <1.0)

## 2022-02-19 LAB — SEDIMENTATION RATE: Sed Rate: 35 mm/hr — ABNORMAL HIGH (ref 0–22)

## 2022-02-19 MED ORDER — GADOBUTROL 1 MMOL/ML IV SOLN
6.0000 mL | Freq: Once | INTRAVENOUS | Status: AC | PRN
  Administered 2022-02-19: 6 mL via INTRAVENOUS

## 2022-02-19 NOTE — ED Provider Notes (Signed)
Received a call from Dr. Darel Hong at [facility] that they are sending this patient over for evaluation. They noted bilateral optic disc edema and believes this patient needs neuroimaging with MRI and bartonella titer. He reported if these are negative, his greatest concern is for malignant hypertension.   Jeanella Flattery 02/19/22 1607    Glendora Score, MD 02/19/22 1740

## 2022-02-19 NOTE — Discharge Instructions (Addendum)
You came to the emergency department today to be evaluated for your blurred vision after being evaluated by Dr. Talbert Forest.  The MRI of your orbits and head showed no acute abnormalities.  I have placed a referral to neurology, you should be contacted in 2-3 business days for a follow-up appointment.  If you do not hear from them please call to schedule a follow-up appointment.  Please call Dr. Talbert Forest tomorrow to discuss your care further.  Get help right away if: You have: Severe eye pain. A severe headache. A sudden change in vision. A sudden loss of vision. A vision change after an injury. You notice flashing lights in your field of vision. Your field of vision is the area that you can see without moving your eyes. You develop any new or concerning headache You develop any numbness, weakness, facial weakness, or slurred speech.

## 2022-02-19 NOTE — ED Provider Triage Note (Signed)
Emergency Medicine Provider Triage Evaluation Note  Maria [name] , a 66 y.o. female  was evaluated in triage.  Pt complains of blurred vision has been worsening since her cataract surgery.  Was seen by her ophthalmologist and sent here for MRI brain and orbits secondary to optic disc edema.  No focal weakness or numbness.  Review of Systems  Positive:  Negative: See above   Physical Exam  BP (!) 185/87 (BP Location: Right Arm)    Pulse (!) 101    Temp 99.2 F (37.3 C) (Oral)    Resp 16    Ht 5' 2.5" (1.588 m)    Wt 61.7 kg    SpO2 100%    BMI 24.48 kg/m  Gen:   Awake, no distress   Resp:  Normal effort  MSK:   Moves extremities without difficulty  Other:    Medical Decision Making  Medically screening exam initiated at 5:15 PM.  Appropriate orders placed.  Arantxa Piercey was informed that the remainder of the evaluation will be completed by another provider, this initial triage assessment does not replace that evaluation, and the importance of remaining in the ED until their evaluation is complete.     Honor Loh [place], [place] 02/19/22 [phone]

## 2022-02-19 NOTE — ED Notes (Signed)
Pt in MRI.

## 2022-02-19 NOTE — ED Triage Notes (Signed)
Patient arrives ambulatory POV C/o blurry vision from both eyes since December. Reports cataract surgery and that sight has not improving like it should. Went to see eye doc today and sent for further evaluation.

## 2022-02-19 NOTE — ED Provider Notes (Signed)
[facility] EMERGENCY DEPARTMENT Provider Note   CSN: [id number] Arrival date & time: 02/19/22  1616     History  Chief Complaint  Patient presents with   Blurred Vision    Maria [name] is a 66 y.o. female with a past medical history of hypertension, hyperlipidemia, pulmonary sarcoidosis, status post cataract surgery.  Patient reports that Dr. Talbert Forest performed cataract surgery on her left eye 09/21/2021 and on the right eye 10/14/2021.    Presents emergency department with a complaint of blurred vision.  Patient reports that she has been dealing with blurred vision since her surgeries.  States that she can see "big things," but cannot see " small detail."  Patient reports that she followed up with Dr. Talbert Forest today and was sent to the emergency department for MRI of brain and orbits secondary to optic disc edema.  Patient reports that optic disc edema was noted in bilateral eyes by Dr. Talbert Forest.    Patient denies any fever, chills, neck pain, neck stiffness, numbness, weakness, facial asymmetry, dysarthria, vision loss, diplopia, headache, seizures, lightheadedness, syncope.    HPI     Home Medications Prior to Admission medications   Medication Sig Start Date End Date Taking? Authorizing Provider  hydrochlorothiazide (HYDRODIURIL) 12.5 MG tablet Take 12.5 mg by mouth daily.     [provider]  levothyroxine (SYNTHROID) 50 MCG tablet Take 1 tablet by mouth. Daily, except 2 tablets on Saturday and Sunday    [provider]  loratadine (CLARITIN) 10 MG tablet Take 10 mg by mouth daily as needed for allergies.    [provider]  omeprazole (PRILOSEC OTC) 20 MG tablet Take 20 mg by mouth as needed.    [provider]  rosuvastatin (CRESTOR) 10 MG tablet Take 10 mg by mouth daily.    [provider]      Allergies    Penicillins    Review of Systems   Review of Systems  Constitutional:  Negative for chills and  fever.  HENT:  Negative for facial swelling.   Eyes:  Positive for visual disturbance. Negative for photophobia, pain, discharge, redness and itching.  Respiratory:  Negative for shortness of breath.   Cardiovascular:  Negative for chest pain.  Gastrointestinal:  Negative for abdominal pain, nausea and vomiting.  Musculoskeletal:  Negative for back pain, neck pain and neck stiffness.  Skin:  Negative for color change and rash.  Neurological:  Negative for dizziness, tremors, seizures, syncope, facial asymmetry, speech difficulty, weakness, light-headedness, numbness and headaches.  Psychiatric/Behavioral:  Negative for confusion.    Physical Exam Updated Vital Signs BP (!) 185/87 (BP Location: Right Arm)    Pulse (!) 101    Temp 99.2 F (37.3 C) (Oral)    Resp 16    Ht 5' 2.5" (1.588 m)    Wt 61.7 kg    SpO2 100%    BMI 24.48 kg/m  Physical Exam Vitals and nursing note reviewed.  Constitutional:      General: She is not in acute distress.    Appearance: She is not ill-appearing, toxic-appearing or diaphoretic.  HENT:     Head: Normocephalic.  Eyes:     General: No scleral icterus.       Right eye: No discharge.        Left eye: No discharge.     Extraocular Movements: Extraocular movements intact.     Conjunctiva/sclera: Conjunctivae normal.     Pupils: Pupils are equal, round, and reactive to  light.     Comments: No pain with ocular movement  Cardiovascular:     Rate and Rhythm: Normal rate.  Pulmonary:     Effort: Pulmonary effort is normal.  Musculoskeletal:     Cervical back: Normal range of motion and neck supple. No edema, erythema, signs of trauma, rigidity, torticollis or crepitus. No pain with movement, spinous process tenderness or muscular tenderness. Normal range of motion.  Skin:    General: Skin is warm and dry.  Neurological:     General: No focal deficit present.     Mental Status: She is alert and oriented to person, place, and time.     GCS: GCS eye  subscore is 4. GCS verbal subscore is 5. GCS motor subscore is 6.     Cranial Nerves: Cranial nerves 2-12 are intact. No cranial nerve deficit, dysarthria or facial asymmetry.     Comments: Patient was all limbs equally without difficulty  Psychiatric:        Behavior: Behavior is cooperative.    ED Results / Procedures / Treatments   Labs (all labs ordered are listed, but only abnormal results are displayed) Labs Reviewed  SEDIMENTATION RATE - Abnormal; Notable for the following components:      Result Value   Sed Rate 35 (*)    All other components within normal limits  C-REACTIVE PROTEIN - Abnormal; Notable for the following components:   CRP 2.8 (*)    All other components within normal limits  CBC WITH DIFFERENTIAL/PLATELET  BASIC METABOLIC PANEL  BARTONELLA ANTIBODY PANEL    EKG None  Radiology MR BRAIN W WO CONTRAST  Result Date: 02/19/2022 CLINICAL DATA:  Bilateral blurry vision EXAM: MRI HEAD AND ORBITS WITHOUT AND WITH CONTRAST TECHNIQUE: Multiplanar, multiecho pulse sequences of the brain and surrounding structures were obtained without and with intravenous contrast. Multiplanar, multiecho pulse sequences of the orbits and surrounding structures were obtained including fat saturation techniques, before and after intravenous contrast administration. CONTRAST:  26mL GADAVIST GADOBUTROL 1 MMOL/ML IV SOLN COMPARISON:  None. FINDINGS: MRI HEAD FINDINGS Brain: No acute infarct, mass effect or extra-axial collection. No acute or chronic hemorrhage. Normal white matter signal, parenchymal volume and CSF spaces. The midline structures are normal. Vascular: Major flow voids are preserved. Skull and upper cervical spine: Normal calvarium and skull base. Visualized upper cervical spine and soft tissues are normal. Sinuses/Orbits:No paranasal sinus fluid levels or advanced mucosal thickening. No mastoid or middle ear effusion. Normal orbits. MRI ORBITS FINDINGS Orbits: No traumatic or  inflammatory finding. Globes, optic nerves, orbital fat, extraocular muscles, vascular structures, and lacrimal glands are normal. Visualized sinuses: Clear. Soft tissues: Negative. IMPRESSION: Normal MRI of the brain and orbits. Electronically Signed   By: Ulyses Jarred M.D.   On: 02/19/2022 20:28   MR ORBITS W WO CONTRAST  Result Date: 02/19/2022 CLINICAL DATA:  Bilateral blurry vision EXAM: MRI HEAD AND ORBITS WITHOUT AND WITH CONTRAST TECHNIQUE: Multiplanar, multiecho pulse sequences of the brain and surrounding structures were obtained without and with intravenous contrast. Multiplanar, multiecho pulse sequences of the orbits and surrounding structures were obtained including fat saturation techniques, before and after intravenous contrast administration. CONTRAST:  70mL GADAVIST GADOBUTROL 1 MMOL/ML IV SOLN COMPARISON:  None. FINDINGS: MRI HEAD FINDINGS Brain: No acute infarct, mass effect or extra-axial collection. No acute or chronic hemorrhage. Normal white matter signal, parenchymal volume and CSF spaces. The midline structures are normal. Vascular: Major flow voids are preserved. Skull and upper cervical spine: Normal calvarium  and skull base. Visualized upper cervical spine and soft tissues are normal. Sinuses/Orbits:No paranasal sinus fluid levels or advanced mucosal thickening. No mastoid or middle ear effusion. Normal orbits. MRI ORBITS FINDINGS Orbits: No traumatic or inflammatory finding. Globes, optic nerves, orbital fat, extraocular muscles, vascular structures, and lacrimal glands are normal. Visualized sinuses: Clear. Soft tissues: Negative. IMPRESSION: Normal MRI of the brain and orbits. Electronically Signed   By: Ulyses Jarred M.D.   On: 02/19/2022 20:28    Procedures Procedures    Medications Ordered in ED Medications  gadobutrol (GADAVIST) 1 MMOL/ML injection 6 mL (6 mLs Intravenous Contrast Given 02/19/22 2016)  gadobutrol (GADAVIST) 1 MMOL/ML injection 6 mL (6 mLs Intravenous  Contrast Given 02/19/22 2020)    ED Course/ Medical Decision Making/ A&P                           Medical Decision Making  Alert 66 year old female in no acute distress, nontoxic-appearing.  Patient is sitting upright on edge of hospital stretcher.  Presents to the emergency department with complaint of blurry vision.  Reports that she was sent by her ophthalmologist Dr. Talbert Forest to obtain MRI of brain and orbits secondary to optic disc edema.  Information is obtained from patient and patient's sister at bedside.  Past medical records were reviewed including previous provider notes.  Patient has past with history as outlined in HPI which complicates her care.  Patient's has no focal neurological deficits.  CN II through XII intact.  Labs and imaging were ordered while patient was in triage.  I personally interpreted labs.  Pertinent findings include: -BMP and CBC unremarkable -C-reactive protein elevated 2.8 -Rate elevated at 35.  Previous sed rate from 14 years earlier at 31. -Bartonella antibody panel pending  MRI of brain and orbits unremarkable.  Due to patient being sent by her ophthalmologist for optic disc edema we will reach out to on-call ophthalmologist.  I spoke with Dr. Katy Fitch who advised that he will reach out to Dr. Talbert Forest and call back after speaking with him about the patient.  I spoke with Dr. Katy Fitch who advised that he was unable to reach Dr. Talbert Forest.  We discussed patient's case including symptoms, labs, and imaging.  Recommends follow-up with Dr. Talbert Forest as well as neurology.  Patient continues to have no complaints of headache or focal neurological deficit on exam.  Will discharge patient at this time.  Discussed results, findings, treatment and follow up. Patient advised of return precautions. Patient verbalized understanding and agreed with plan.  Patient was discussed with attending physician  Dr. Laverta Baltimore.         Final Clinical Impression(s) / ED Diagnoses Final  diagnoses:  Blurred vision, bilateral    Rx / DC Orders ED Discharge Orders          Ordered    Ambulatory referral to Neurology       Comments: An appointment is requested in approximately: 1 week   02/19/22 2239              Dyann Ruddle 02/19/22 2244    Margette Fast, MD 02/21/22 [phone]

## 2022-02-20 LAB — BARTONELLA ANTIBODY PANEL
B Quintana IgM: NEGATIVE titer
B henselae IgG: NEGATIVE titer
B henselae IgM: NEGATIVE titer
B quintana IgG: NEGATIVE titer

## 2022-03-05 NOTE — Progress Notes (Addendum)
[facility]  PATIENT: Maria [name] DOB: [birth year]  REFERRING DOCTOR OR PCP: Irena Reichmann, DO (PCP); Stephannie Li, MD, Mia Creek, MD (ophthalmology)    SOURCE: Patient, notes from Dr. Vonna Kotyk, notes from pulmonology, imaging and laboratory reports, MRI images personally reviewed.  _________________________________   HISTORICAL  CHIEF COMPLAINT:  Chief Complaint  Patient presents with   New Patient (Initial Visit)    Pt alone, rm 1,  she had cataract sx and the last procedure was October 23rd. She is still taking drops for this. 02/19/22 went to ER due to blurred vision post sx. They completed MRI imaging. She has followed with the ophthalmology and they advised to keep the apt today to make sure everything was ok from neuro standpoint    HISTORY OF PRESENT ILLNESS:  I had the pleasure of seeing your patient, Maria [name], at [facility] for neurologic consultation regarding her optic nerve edema, visual changes and history of sarcoidosis  She is a 66 year old woman who had cataract surgery in October 2022.   Initially, she felt she recovered well.  However, she had the onset of blurry vision December 2023.   She saw ophthalmology and was noted to havee optic nerve edema bilaterally.   Vision changes were painless.    She denies any headache, jaw claudication.   BP is normal most times.  Was mildly elevated at check-in today.     She had sarcoidosis (pulmonary only) in her 8's and 67's.   She ever needed to be on medication and was d/c form pulmonology about 10 years ago .     Ophthalmology exam2/28/2023 (Dr. Vonna Kotyk) was reviewed.  It showed visual acuity was OS 20/80; OD 20/80.   Marland Kitchen   She had bilateral disc edema, confirmed on OCT RNFL.    The macula was fine.   IOP was elevated 26/28.  She received intraocular steroids and is on Dorzolamide/timolol eyedrops.    She has noted mild improvement and can now see the TV better.    Colors  are not distorted.  IMAGING/LABS personally reviewed: MRI of the head and orbits 02/19/2022 shows a single punctate T2/flair hyperintense focus in the right frontoparietal subcortical white matter.  Brain volume is normal for age.  No acute findings.  Normal enhancement pattern.  The orbit is normal.  The single focus is nonspecific, likely due to very minimal chronic microvascular ischemic change that is normal for age.  02/19/2022:  ESR = 35    CRP was 2.8   Bartonella negative.   CBC/BMP normal.     REVIEW OF SYSTEMS: Constitutional: No fevers, chills, sweats, or change in appetite Eyes: As above.   Ear, nose and throat: No hearing loss, ear pain, nasal congestion, sore throat Cardiovascular: No chest pain, palpitations Respiratory:  No shortness of breath at rest or with exertion.   No wheezes GastrointestinaI: No nausea, vomiting, diarrhea, abdominal pain, fecal incontinence Genitourinary:  No dysuria, urinary retention or frequency.  No nocturia. Musculoskeletal:  No neck pain, back pain Integumentary: No rash, pruritus, skin lesions Neurological: as above Psychiatric: No depression at this time.  No anxiety Endocrine: No palpitations, diaphoresis, change in appetite, change in weigh or increased thirst Hematologic/Lymphatic:  No anemia, purpura, petechiae. Allergic/Immunologic: No itchy/runny eyes, nasal congestion, recent allergic reactions, rashes  ALLERGIES: Allergies  Allergen Reactions   Penicillins Rash    Has patient had a PCN reaction causing immediate rash, facial/tongue/throat swelling, SOB or lightheadedness with hypotension: No Has  patient had a PCN reaction causing severe rash involving mucus membranes or skin necrosis: No Has patient had a PCN reaction that required hospitalization: No Has patient had a PCN reaction occurring within the last 10 years: No If all of the above answers are "NO", then may proceed with Cephalosporin use.     HOME  MEDICATIONS:  Current Outpatient Medications:    hydrochlorothiazide (HYDRODIURIL) 12.5 MG tablet, Take 12.5 mg by mouth daily. , Disp: , Rfl:    levothyroxine (SYNTHROID) 50 MCG tablet, Take 1 tablet by mouth. Daily, except 2 tablets on Saturday and Sunday, Disp: , Rfl:    loratadine (CLARITIN) 10 MG tablet, Take 10 mg by mouth daily as needed for allergies., Disp: , Rfl:    omeprazole (PRILOSEC OTC) 20 MG tablet, Take 20 mg by mouth as needed., Disp: , Rfl:    rosuvastatin (CRESTOR) 10 MG tablet, Take 10 mg by mouth daily., Disp: , Rfl:   PAST MEDICAL HISTORY: Past Medical History:  Diagnosis Date   GERD (gastroesophageal reflux disease)    Hyperlipidemia    Hypertension    Hypothyroidism    Pulmonary sarcoidosis (HCC)    Sarcoidosis    Viral URI     PAST SURGICAL HISTORY: Past Surgical History:  Procedure Laterality Date   ABDOMINAL HYSTERECTOMY     BUNIONECTOMY Bilateral    COLONOSCOPY  2007   MASS EXCISION Bilateral 05/15/2021   Procedure: Excision of bilateral accessory axillary breast tissue;  Surgeon: Allena Napoleon, MD;  Location: [facility];  Service: Plastics;  Laterality: Bilateral;  1 hour   mediastinal node biopsy     dx with sarcoid   MINOR CARPAL TUNNEL Right 03/01/2016   Procedure: RIGHT CARPAL TUNNEL RELEASE;  Surgeon: Dominica Severin, MD;  Location: [facility];  Service: Orthopedics;  Laterality: Right;    FAMILY HISTORY: Family History  Problem Relation Age of Onset   Heart disease Mother    Heart disease Father    Breast cancer Sister    Colon cancer Neg Hx    Esophageal cancer Neg Hx    Rectal cancer Neg Hx    Stomach cancer Neg Hx    Colon polyps Neg Hx     SOCIAL HISTORY:  Social History   Socioeconomic History   Marital status: Divorced    Spouse name: Not on file   Number of children: 2   Years of education: Not on file   Highest education level: Not on file  Occupational History   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  Tobacco Use   Smoking status: Never   Smokeless tobacco: Never  Vaping Use   Vaping Use: Never used  Substance and Sexual Activity   Alcohol use: No   Drug use: No   Sexual activity: Not on file  Other Topics Concern   Not on file  Social History Narrative   Not on file   Social Determinants of Health   Financial Resource Strain: Not on file  Food Insecurity: Not on file  Transportation Needs: Not on file  Physical Activity: Not on file  Stress: Not on file  Social Connections: Not on file  Intimate Partner Violence: Not on file     PHYSICAL EXAM  Vitals:   03/06/22 1347  BP: (!) 158/95  Pulse: 83  Weight: 136 lb (61.7 kg)  Height: 5' 2.5" (1.588 m)    Body mass index is 24.48 kg/m.  Visual acuity: OS:  20/70 OD:  20/70  General: The patient is well-developed and well-nourished and in no acute distress  HEENT:  Head is [place]/AT.  Sclera are anicteric.  Funduscopic examination showed possible disc edema.  Neck: No carotid bruits are noted.  The neck is nontender.  Cardiovascular: The heart has a regular rate and rhythm with a normal S1 and S2. There were no murmurs, gallops or rubs.    Skin: Extremities are without rash or  edema.  Musculoskeletal:  Back is nontender  Neurologic Exam  Mental status: The patient is alert and oriented x 3 at the time of the examination. The patient has apparent normal recent and remote memory, with an apparently normal attention span and concentration ability.   Speech is normal.  Cranial nerves: Extraocular movements are full.  Pupils were symmetric.  Color vision was symmetric.  Facial symmetry is present. There is good facial sensation to soft touch bilaterally.Facial strength is normal.  Trapezius and sternocleidomastoid strength is normal. No dysarthria is noted.  The tongue is midline, and the patient has symmetric elevation of the soft palate. No obvious hearing deficits are  noted.  Motor:  Muscle bulk is normal.   Tone is normal. Strength is  5 / 5 in all 4 extremities.   Sensory: Sensory testing is intact to pinprick, soft touch and vibration sensation in all 4 extremities.  Coordination: Cerebellar testing reveals good finger-nose-finger and heel-to-shin bilaterally.  Gait and station: Station is normal.   Gait is normal. Tandem gait is normal for age. Romberg is negative.   Reflexes: Deep tendon reflexes are symmetric and normal bilaterally.   Plantar responses are flexor.    DIAGNOSTIC DATA (LABS, IMAGING, TESTING) - I reviewed patient records, labs, notes, testing and imaging myself where available.  Lab Results  Component Value Date   WBC 7.8 02/19/2022   HGB 14.0 02/19/2022   HCT 43.7 02/19/2022   MCV 87.2 02/19/2022   PLT 294 02/19/2022      Component Value Date/Time   NA 139 02/19/2022 1721   K 3.6 02/19/2022 1721   CL 102 02/19/2022 1721   CO2 25 02/19/2022 1721   GLUCOSE 94 02/19/2022 1721   BUN 12 02/19/2022 1721   CREATININE 0.79 02/19/2022 1721   CALCIUM 10.2 02/19/2022 1721   PROT 8.7 (H) 12/13/2014 0950   ALBUMIN 4.1 12/13/2014 0950   AST 22 12/13/2014 0950   ALT 18 12/13/2014 0950   ALKPHOS 66 12/13/2014 0950   BILITOT 0.5 12/13/2014 0950   GFRNONAA >60 02/19/2022 1721   GFRAA >60 02/01/2016 0830      ASSESSMENT AND PLAN  Visual disturbance - Plan: Angiotensin converting enzyme, RPR, DG FL GUIDED LUMBAR PUNCTURE  Optic disc edema - Plan: Angiotensin converting enzyme, RPR, DG FL GUIDED LUMBAR PUNCTURE  Sarcoidosis - Plan: Angiotensin converting enzyme, DG Chest 2 View, RPR, DG FL GUIDED LUMBAR PUNCTURE  In summary, Ms. Beidleman is a 66 year old woman with visual disturbance since December, about 2 months after she had cataract surgery.  She has optic nerve edema bilaterally.  The etiology is uncertain.  With bilateral optic nerve edema, I am most concerned about elevated intracranial pressure and we need to check a  lumbar puncture to measure opening pressure and to check additional lab work.  Although she has a history of sarcoidosis, it was limited to the lungs and was fairly mild.  Therefore, I am uncertain if that could be playing any role.  I will have her get a chest  x-ray and we will check an angiotensin-converting enzyme.  She is scheduled to see pulmonology next week.  ESR and CRP were mildly elevated, though she has no headache, jaw claudication or other symptoms suggestive of temporal arteritis.  If intracranial pressure is elevated I will start acetazolamide.  Based on the results and whether or not we need to begin medication, follow-up will be determined.  She should call us if she has new or worsening symptoms.  Thank you for asking [place] to see Ms. Zonia Kief.  Please let me know if I can be of further assistance with her or other patients in the future.   Addendum: I received a copy of her ophthalmologic evaluation from 02/22/2022 with Dr. Vonna Kotyk several days after her visit.  On that exam, she was noted to have no optic nerve edema though she did have 4+ cystoid macular edema bilaterally.  Both of these ophthalmologic issues could be seen with sarcoidosis and the plan will remain the same.  Lumbar puncture is scheduled tomorrow and CSF ACE along with other labs has been requested.   -- RAS (03/13/2022)  Samnang Shugars A. Epimenio Foot, MD, [facility] 03/06/2022, 2:27 PM Certified in Neurology, Clinical Neurophysiology, Sleep Medicine and Neuroimaging  [facility] [address] [place], [place] [address] [phone]

## 2022-03-06 ENCOUNTER — Ambulatory Visit (INDEPENDENT_AMBULATORY_CARE_PROVIDER_SITE_OTHER): Payer: Medicare Other | Admitting: Neurology

## 2022-03-06 ENCOUNTER — Encounter: Payer: Self-pay | Admitting: Neurology

## 2022-03-06 ENCOUNTER — Other Ambulatory Visit: Payer: Self-pay

## 2022-03-06 VITALS — BP 158/95 | HR 83 | Ht 62.5 in | Wt 136.0 lb

## 2022-03-06 DIAGNOSIS — H4710 Unspecified papilledema: Secondary | ICD-10-CM | POA: Diagnosis not present

## 2022-03-06 DIAGNOSIS — H539 Unspecified visual disturbance: Secondary | ICD-10-CM | POA: Diagnosis not present

## 2022-03-06 DIAGNOSIS — D869 Sarcoidosis, unspecified: Secondary | ICD-10-CM | POA: Diagnosis not present

## 2022-03-07 ENCOUNTER — Other Ambulatory Visit: Payer: Self-pay

## 2022-03-07 ENCOUNTER — Ambulatory Visit
Admission: RE | Admit: 2022-03-07 | Discharge: 2022-03-07 | Disposition: A | Discharge status: Home / Self Care | Payer: Medicare Other | Source: Ambulatory Visit | Attending: Neurology | Admitting: Neurology

## 2022-03-07 LAB — ANGIOTENSIN CONVERTING ENZYME: Angio Convert Enzyme: 36 U/L (ref 14–82)

## 2022-03-07 LAB — RPR: RPR Ser Ql: NONREACTIVE

## 2022-03-11 NOTE — Progress Notes (Signed)
? ?Synopsis: Referred for sarcoidosis by Irena Reichmannollins, Dana, DO ? ?Subjective:  ? ?PATIENT ID: Maria BeckmannGeorgianna [name] GENDER: female DOB: 1956-03-27, MRN: [id number] ? ?Chief Complaint  ?Patient presents with  ? Pulmonary Consult  ?  Self referral for Sarcoid- seen here last by Dr Maple HudsonYoung in 2015. She states had cataract surgery in Oct 2022 and did not do well after, vision just starting to improve as of a wk ago- opthalmology advised sarcoid could be the issue. She denies any respiratory co'.s  ? ?65yF with history of GERD, sarcoidosis followed by Dr. Maple HudsonYoung ? ?She saw [facility]. Stephannie LiJason Sanders [facility] about 2 weeks ago where she had injections with maybe steroids. She thinks she was told that she may have ocular involvement from sarcoidosis. She claims she didn't have any issues with her vision until she had cataract surgery with Dr. Darel HongBeavis, then in Nov/Dec 2022 developed blurry vision. Tried variety of eye drops. Hasn't been able to drive a car since December. No photophobia, HA. No red eye.  ? ?No DOE, cough, CP, rash, palpitations.  ? ?Otherwise pertinent review of systems is negative. ? ?No family history of sarcoid. ? 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 Has never lived outside of [place].  ? ? ? ?Past Medical History:  ?Diagnosis Date  ? GERD (gastroesophageal reflux disease)   ? Hyperlipidemia   ? Hypertension   ? Hypothyroidism   ? Pulmonary sarcoidosis (HCC)   ? Sarcoidosis   ? Viral URI   ?  ? ?Family History  ?Problem Relation Age of Onset  ? Heart disease Mother   ? Heart disease Father   ? Breast cancer Sister   ? Colon cancer Neg Hx   ? Esophageal cancer Neg Hx   ? Rectal cancer Neg Hx   ? Stomach cancer Neg Hx   ? Colon polyps Neg Hx   ?  ? ?Past Surgical History:  ?Procedure Laterality Date  ? ABDOMINAL HYSTERECTOMY    ? BUNIONECTOMY Bilateral   ? COLONOSCOPY  2007  ? MASS EXCISION Bilateral 05/15/2021  ? Procedure: Excision of bilateral accessory axillary breast tissue;   Surgeon: Allena NapoleonPace, Collier S, MD;  Location: [facility];  Service: Plastics;  Laterality: Bilateral;  1 hour  ? mediastinal node biopsy    ? dx with sarcoid  ? MINOR CARPAL TUNNEL Right 03/01/2016  ? Procedure: RIGHT CARPAL TUNNEL RELEASE;  Surgeon: Dominica SeverinWilliam Gramig, MD;  Location: [facility];  Service: Orthopedics;  Laterality: Right;  ? ? ?Social History  ? ?Socioeconomic History  ? Marital status: Divorced  ?  Spouse name: Not on file  ? Number of children: 2  ? Years of education: Not on file  ? Highest education level: Not on file  ?Occupational History  ? 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 Smoking status: Never  ? Smokeless tobacco: Never  ?Vaping Use  ? Vaping Use: Never used  ?Substance and Sexual Activity  ? Alcohol use: No  ? Drug use: No  ? Sexual activity: Not on file  ?Other Topics Concern  ? Not on file  ?Social History Narrative  ? Not on file  ? ?Social Determinants of Health  ? ?Financial Resource Strain: Not on file  ?Food Insecurity: Not on file  ?Transportation Needs: Not on file  ?Physical Activity: Not on file  ?Stress: Not on file  ?Social Connections: Not on file  ?Intimate Partner  Violence: Not on file  ?  ? ?Allergies  ?Allergen Reactions  ? Penicillins Rash  ?  Has patient had a PCN reaction causing immediate rash, facial/tongue/throat swelling, SOB or lightheadedness with hypotension: No ?Has patient had a PCN reaction causing severe rash involving mucus membranes or skin necrosis: No ?Has patient had a PCN reaction that required hospitalization: No ?Has patient had a PCN reaction occurring within the last 10 years: No ?If all of the above answers are "NO", then may proceed with Cephalosporin use. ?  ?  ? ?Outpatient Medications Prior to Visit  ?Medication Sig Dispense Refill  ? brimonidine (ALPHAGAN) 0.15 % ophthalmic solution Place 1 drop into both eyes in the morning and at bedtime.    ? hydrochlorothiazide (HYDRODIURIL)  12.5 MG tablet Take 12.5 mg by mouth daily.     ? levothyroxine (SYNTHROID) 50 MCG tablet Take 1 tablet by mouth. Daily, except 2 tablets on Saturday and Sunday    ? loratadine (CLARITIN) 10 MG tablet Take 10 mg by mouth daily as needed for allergies.    ? omeprazole (PRILOSEC OTC) 20 MG tablet Take 20 mg by mouth as needed.    ? rosuvastatin (CRESTOR) 10 MG tablet Take 10 mg by mouth daily.    ? ?No facility-administered medications prior to visit.  ? ? ? ? ? ?Objective:  ? ?Physical Exam: ? ?General appearance: 66 y.o., female, NAD, conversant  ?Eyes: anicteric sclerae; PERRL, tracking appropriately ?HENT: NCAT; MMM ?Neck: Trachea midline; no lymphadenopathy, no JVD ?Lungs: CTAB, no crackles, no wheeze, with normal respiratory effort ?CV: RRR, no murmur  ?Abdomen: Soft, non-tender; non-distended, BS present  ?Extremities: No peripheral edema, warm ?Skin: Normal turgor and texture; no rash ?Psych: Appropriate affect ?Neuro: Alert and oriented to person and place, no focal deficit  ? ? ? ?Vitals:  ? 03/13/22 1023  ?BP: 128/84  ?Pulse: 91  ?Temp: 99.2 ?F (37.3 ?C)  ?TempSrc: Oral  ?SpO2: 100%  ?Weight: 138 lb 6.4 oz (62.8 kg)  ?Height: 5' 2.5" (1.588 m)  ? ?100% on RA ?BMI Readings from Last 3 Encounters:  ?03/13/22 24.91 kg/m?  ?03/06/22 24.48 kg/m?  ?02/19/22 24.48 kg/m?  ? ?Wt Readings from Last 3 Encounters:  ?03/13/22 138 lb 6.4 oz (62.8 kg)  ?03/06/22 136 lb (61.7 kg)  ?02/19/22 136 lb (61.7 kg)  ? ? ? ?CBC ?   ?Component Value Date/Time  ? WBC 7.8 02/19/2022 1721  ? RBC 5.01 02/19/2022 1721  ? HGB 14.0 02/19/2022 1721  ? HGB 12.5 05/12/2007 1530  ? HCT 43.7 02/19/2022 1721  ? HCT 36.6 05/12/2007 1530  ? PLT 294 02/19/2022 1721  ? PLT 348 05/12/2007 1530  ? MCV 87.2 02/19/2022 1721  ? MCV 85.1 05/12/2007 1530  ? MCH 27.9 02/19/2022 1721  ? MCHC 32.0 02/19/2022 1721  ? RDW 13.4 02/19/2022 1721  ? RDW 13.4 05/12/2007 1530  ? LYMPHSABS 2.5 02/19/2022 1721  ? LYMPHSABS 1.7 05/12/2007 1530  ? MONOABS 0.6 02/19/2022  1721  ? MONOABS 0.8 05/12/2007 1530  ? EOSABS 0.2 02/19/2022 1721  ? EOSABS 0.3 05/12/2007 1530  ? BASOSABS 0.1 02/19/2022 1721  ? BASOSABS 0.0 05/12/2007 1530  ? ? ? ? ?Chest Imaging: ?CXR 03/09/22 reviewed by me with fullness in AP window, stable ? ?MR brain/orbits 02/19/22 normal  ? ?Pulmonary Functions Testing Results: ?   ? View : No data to display.  ?  ?  ?  ? ? ? ? ?Pathology:  ? ?Mediastinoscopy 2004: ? ?  1. LYMPH NODE, 4L, BIOPSY: NONCASEATING GRANULOMATOUS  ?INFLAMMATION.  ? ?2. LYMPH NODE, 2L, BIOPSY: NONCASEATING GRANULOMATOUS  ?INFLAMMATION  ? ?3. LYMPH NODE, 4R, BIOPSY: NONCASEATING GRANULOMATOUS  ?INFLAMMATION  ? ? ?   ?Assessment & Plan:  ? ?# Sarcoidosis ?Biopsy proven. Seems quiescent from symptomatic standpoint unless ophtho feels convinced she has ocular involvement. CXR is stable. Has had BMP this year without hypercalcemia or renal insufficiency, EKG this past year. ? ?# Optic nerve edema ?# Blurry vision ? ?Plan: ?- We could perform PFTs but it doesn't seem like sarcoid is active unless ophtho feels convinced she has ocular involvement. If they truly feel concerned she has ocular involvement then we could at least consider repeating PFT to ensure no significant lung function decline but even if she had asymptomatic lung function decline I'd let ocular involvement dictate treatment decision. Awaiting records from Dr. Allyne Gee office - if they feel convinced there is ocular involvement then I'll discuss obtaining PFT with Ms. Zonia Kief. ? ? ?RTC prn ? ?Omar Person, MD ?[facility] ?03/13/2022 2:01 PM  ? ?

## 2022-03-13 ENCOUNTER — Other Ambulatory Visit: Payer: Self-pay

## 2022-03-13 ENCOUNTER — Ambulatory Visit (INDEPENDENT_AMBULATORY_CARE_PROVIDER_SITE_OTHER): Payer: Medicare Other | Admitting: Student

## 2022-03-13 ENCOUNTER — Encounter: Payer: Self-pay | Admitting: Student

## 2022-03-13 VITALS — BP 128/84 | HR 91 | Temp 99.2°F | Ht 62.5 in | Wt 138.4 lb

## 2022-03-13 DIAGNOSIS — D869 Sarcoidosis, unspecified: Secondary | ICD-10-CM | POA: Diagnosis not present

## 2022-03-13 NOTE — Patient Instructions (Signed)
I will be in touch after reviewing records from Dr. Allyne Gee. If they feel strongly that there is evidence of active sarcoidosis affecting your vision then I probably would recommend doing at least a set of pulmonary function tests to make sure we're not missing some lung function decline due to sarcoidosis ?

## 2022-03-14 ENCOUNTER — Ambulatory Visit
Admission: RE | Admit: 2022-03-14 | Discharge: 2022-03-14 | Disposition: A | Discharge status: Home / Self Care | Payer: Medicare Other | Source: Ambulatory Visit | Attending: Neurology | Admitting: Neurology

## 2022-03-14 DIAGNOSIS — H539 Unspecified visual disturbance: Secondary | ICD-10-CM

## 2022-03-14 DIAGNOSIS — D869 Sarcoidosis, unspecified: Secondary | ICD-10-CM

## 2022-03-14 DIAGNOSIS — H4710 Unspecified papilledema: Secondary | ICD-10-CM

## 2022-03-14 NOTE — Discharge Instructions (Signed)

## 2022-03-19 LAB — VDRL, CSF: VDRL Quant, CSF: NONREACTIVE

## 2022-03-19 LAB — CSF CELL COUNT WITH DIFFERENTIAL
RBC Count, CSF: 1 cells/uL — ABNORMAL HIGH
WBC, CSF: 1 cells/uL (ref 0–5)

## 2022-03-19 LAB — GLUCOSE, CSF: Glucose, CSF: 58 mg/dL (ref 40–80)

## 2022-03-19 LAB — ANGIOTENSIN CONVERTING ENZYME, CSF: ANGIOTENSIN CONVERTING ENZYME ( ACE) CSF: 6 U/L (ref <=15)

## 2022-03-19 LAB — PROTEIN, CSF: Total Protein, CSF: 54 mg/dL (ref 15–60)

## 2022-07-30 DIAGNOSIS — H318 Other specified disorders of choroid: Secondary | ICD-10-CM | POA: Diagnosis not present

## 2022-07-30 DIAGNOSIS — H40043 Steroid responder, bilateral: Secondary | ICD-10-CM | POA: Diagnosis not present

## 2022-07-30 DIAGNOSIS — H2013 Chronic iridocyclitis, bilateral: Secondary | ICD-10-CM | POA: Diagnosis not present

## 2022-07-30 DIAGNOSIS — H35353 Cystoid macular degeneration, bilateral: Secondary | ICD-10-CM | POA: Diagnosis not present

## 2022-07-30 DIAGNOSIS — Z961 Presence of intraocular lens: Secondary | ICD-10-CM | POA: Diagnosis not present

## 2022-07-30 DIAGNOSIS — H35373 Puckering of macula, bilateral: Secondary | ICD-10-CM | POA: Diagnosis not present

## 2022-08-30 DIAGNOSIS — H30043 Focal chorioretinal inflammation, macular or paramacular, bilateral: Secondary | ICD-10-CM | POA: Diagnosis not present

## 2022-08-30 DIAGNOSIS — H35353 Cystoid macular degeneration, bilateral: Secondary | ICD-10-CM | POA: Diagnosis not present

## 2022-08-30 DIAGNOSIS — H43813 Vitreous degeneration, bilateral: Secondary | ICD-10-CM | POA: Diagnosis not present

## 2022-09-10 DIAGNOSIS — H35352 Cystoid macular degeneration, left eye: Secondary | ICD-10-CM | POA: Diagnosis not present

## 2022-09-10 DIAGNOSIS — H30042 Focal chorioretinal inflammation, macular or paramacular, left eye: Secondary | ICD-10-CM | POA: Diagnosis not present

## 2022-09-25 DIAGNOSIS — H35351 Cystoid macular degeneration, right eye: Secondary | ICD-10-CM | POA: Diagnosis not present

## 2022-09-25 DIAGNOSIS — H35353 Cystoid macular degeneration, bilateral: Secondary | ICD-10-CM | POA: Diagnosis not present

## 2022-10-30 DIAGNOSIS — H52223 Regular astigmatism, bilateral: Secondary | ICD-10-CM | POA: Diagnosis not present

## 2022-10-30 DIAGNOSIS — Z961 Presence of intraocular lens: Secondary | ICD-10-CM | POA: Diagnosis not present

## 2022-11-06 DIAGNOSIS — H35353 Cystoid macular degeneration, bilateral: Secondary | ICD-10-CM | POA: Diagnosis not present

## 2022-11-06 DIAGNOSIS — H30043 Focal chorioretinal inflammation, macular or paramacular, bilateral: Secondary | ICD-10-CM | POA: Diagnosis not present

## 2022-11-06 DIAGNOSIS — H43813 Vitreous degeneration, bilateral: Secondary | ICD-10-CM | POA: Diagnosis not present

## 2022-11-06 DIAGNOSIS — H40053 Ocular hypertension, bilateral: Secondary | ICD-10-CM | POA: Diagnosis not present

## 2022-11-12 DIAGNOSIS — Z5982 Transportation insecurity: Secondary | ICD-10-CM | POA: Diagnosis not present

## 2022-11-12 DIAGNOSIS — Z880 Allergy status to penicillin: Secondary | ICD-10-CM | POA: Diagnosis not present

## 2022-11-12 DIAGNOSIS — E785 Hyperlipidemia, unspecified: Secondary | ICD-10-CM | POA: Diagnosis not present

## 2022-11-12 DIAGNOSIS — I10 Essential (primary) hypertension: Secondary | ICD-10-CM | POA: Diagnosis not present

## 2022-11-12 DIAGNOSIS — Z823 Family history of stroke: Secondary | ICD-10-CM | POA: Diagnosis not present

## 2022-11-12 DIAGNOSIS — E039 Hypothyroidism, unspecified: Secondary | ICD-10-CM | POA: Diagnosis not present

## 2022-11-12 DIAGNOSIS — Z8249 Family history of ischemic heart disease and other diseases of the circulatory system: Secondary | ICD-10-CM | POA: Diagnosis not present

## 2022-11-12 DIAGNOSIS — H409 Unspecified glaucoma: Secondary | ICD-10-CM | POA: Diagnosis not present

## 2022-11-12 DIAGNOSIS — Z008 Encounter for other general examination: Secondary | ICD-10-CM | POA: Diagnosis not present

## 2022-11-12 DIAGNOSIS — K219 Gastro-esophageal reflux disease without esophagitis: Secondary | ICD-10-CM | POA: Diagnosis not present

## 2022-11-12 DIAGNOSIS — Z809 Family history of malignant neoplasm, unspecified: Secondary | ICD-10-CM | POA: Diagnosis not present

## 2023-01-23 DIAGNOSIS — E7800 Pure hypercholesterolemia, unspecified: Secondary | ICD-10-CM | POA: Diagnosis not present

## 2023-01-23 DIAGNOSIS — E039 Hypothyroidism, unspecified: Secondary | ICD-10-CM | POA: Diagnosis not present

## 2023-01-23 DIAGNOSIS — E119 Type 2 diabetes mellitus without complications: Secondary | ICD-10-CM | POA: Diagnosis not present

## 2023-01-23 DIAGNOSIS — I10 Essential (primary) hypertension: Secondary | ICD-10-CM | POA: Diagnosis not present

## 2023-01-29 DIAGNOSIS — H35353 Cystoid macular degeneration, bilateral: Secondary | ICD-10-CM | POA: Diagnosis not present

## 2023-01-29 DIAGNOSIS — H43813 Vitreous degeneration, bilateral: Secondary | ICD-10-CM | POA: Diagnosis not present

## 2023-01-29 DIAGNOSIS — H40053 Ocular hypertension, bilateral: Secondary | ICD-10-CM | POA: Diagnosis not present

## 2023-01-29 DIAGNOSIS — H30043 Focal chorioretinal inflammation, macular or paramacular, bilateral: Secondary | ICD-10-CM | POA: Diagnosis not present

## 2023-01-30 DIAGNOSIS — E119 Type 2 diabetes mellitus without complications: Secondary | ICD-10-CM | POA: Diagnosis not present

## 2023-01-30 DIAGNOSIS — Z0000 Encounter for general adult medical examination without abnormal findings: Secondary | ICD-10-CM | POA: Diagnosis not present

## 2023-01-30 DIAGNOSIS — Z9109 Other allergy status, other than to drugs and biological substances: Secondary | ICD-10-CM | POA: Diagnosis not present

## 2023-01-30 DIAGNOSIS — I10 Essential (primary) hypertension: Secondary | ICD-10-CM | POA: Diagnosis not present

## 2023-01-30 DIAGNOSIS — E7800 Pure hypercholesterolemia, unspecified: Secondary | ICD-10-CM | POA: Diagnosis not present

## 2023-01-30 DIAGNOSIS — E039 Hypothyroidism, unspecified: Secondary | ICD-10-CM | POA: Diagnosis not present

## 2023-01-30 DIAGNOSIS — H4710 Unspecified papilledema: Secondary | ICD-10-CM | POA: Diagnosis not present

## 2023-02-12 DIAGNOSIS — H30043 Focal chorioretinal inflammation, macular or paramacular, bilateral: Secondary | ICD-10-CM | POA: Diagnosis not present

## 2023-03-14 DIAGNOSIS — H30043 Focal chorioretinal inflammation, macular or paramacular, bilateral: Secondary | ICD-10-CM | POA: Diagnosis not present

## 2023-03-14 DIAGNOSIS — H35353 Cystoid macular degeneration, bilateral: Secondary | ICD-10-CM | POA: Diagnosis not present

## 2023-03-14 DIAGNOSIS — H43813 Vitreous degeneration, bilateral: Secondary | ICD-10-CM | POA: Diagnosis not present

## 2023-03-14 DIAGNOSIS — H40053 Ocular hypertension, bilateral: Secondary | ICD-10-CM | POA: Diagnosis not present

## 2023-03-24 DIAGNOSIS — H30043 Focal chorioretinal inflammation, macular or paramacular, bilateral: Secondary | ICD-10-CM | POA: Diagnosis not present

## 2023-03-31 DIAGNOSIS — Z1231 Encounter for screening mammogram for malignant neoplasm of breast: Secondary | ICD-10-CM | POA: Diagnosis not present

## 2023-03-31 DIAGNOSIS — Z01419 Encounter for gynecological examination (general) (routine) without abnormal findings: Secondary | ICD-10-CM | POA: Diagnosis not present

## 2023-03-31 DIAGNOSIS — Z6824 Body mass index (BMI) 24.0-24.9, adult: Secondary | ICD-10-CM | POA: Diagnosis not present

## 2023-04-01 DIAGNOSIS — H40053 Ocular hypertension, bilateral: Secondary | ICD-10-CM | POA: Diagnosis not present

## 2023-04-07 ENCOUNTER — Other Ambulatory Visit: Payer: Self-pay | Admitting: Obstetrics and Gynecology

## 2023-04-07 DIAGNOSIS — R928 Other abnormal and inconclusive findings on diagnostic imaging of breast: Secondary | ICD-10-CM

## 2023-04-18 ENCOUNTER — Ambulatory Visit
Admission: RE | Admit: 2023-04-18 | Discharge: 2023-04-18 | Disposition: A | Discharge status: Home / Self Care | Payer: Medicare HMO | Source: Ambulatory Visit | Attending: Obstetrics and Gynecology | Admitting: Obstetrics and Gynecology

## 2023-04-18 ENCOUNTER — Ambulatory Visit: Payer: Medicare Other

## 2023-04-18 DIAGNOSIS — R92331 Mammographic heterogeneous density, right breast: Secondary | ICD-10-CM | POA: Diagnosis not present

## 2023-04-18 DIAGNOSIS — R928 Other abnormal and inconclusive findings on diagnostic imaging of breast: Secondary | ICD-10-CM

## 2023-04-21 DIAGNOSIS — H40052 Ocular hypertension, left eye: Secondary | ICD-10-CM | POA: Diagnosis not present

## 2023-04-21 DIAGNOSIS — H409 Unspecified glaucoma: Secondary | ICD-10-CM | POA: Diagnosis not present

## 2023-05-20 DIAGNOSIS — H35353 Cystoid macular degeneration, bilateral: Secondary | ICD-10-CM | POA: Diagnosis not present

## 2023-05-20 DIAGNOSIS — H30043 Focal chorioretinal inflammation, macular or paramacular, bilateral: Secondary | ICD-10-CM | POA: Diagnosis not present

## 2023-05-20 DIAGNOSIS — H401130 Primary open-angle glaucoma, bilateral, stage unspecified: Secondary | ICD-10-CM | POA: Diagnosis not present

## 2023-06-17 DIAGNOSIS — H401130 Primary open-angle glaucoma, bilateral, stage unspecified: Secondary | ICD-10-CM | POA: Diagnosis not present

## 2023-06-17 DIAGNOSIS — H30043 Focal chorioretinal inflammation, macular or paramacular, bilateral: Secondary | ICD-10-CM | POA: Diagnosis not present

## 2023-06-17 DIAGNOSIS — H35353 Cystoid macular degeneration, bilateral: Secondary | ICD-10-CM | POA: Diagnosis not present

## 2023-06-24 DIAGNOSIS — Z809 Family history of malignant neoplasm, unspecified: Secondary | ICD-10-CM | POA: Diagnosis not present

## 2023-06-24 DIAGNOSIS — E785 Hyperlipidemia, unspecified: Secondary | ICD-10-CM | POA: Diagnosis not present

## 2023-06-24 DIAGNOSIS — E039 Hypothyroidism, unspecified: Secondary | ICD-10-CM | POA: Diagnosis not present

## 2023-06-24 DIAGNOSIS — I10 Essential (primary) hypertension: Secondary | ICD-10-CM | POA: Diagnosis not present

## 2023-06-24 DIAGNOSIS — K219 Gastro-esophageal reflux disease without esophagitis: Secondary | ICD-10-CM | POA: Diagnosis not present

## 2023-06-24 DIAGNOSIS — J309 Allergic rhinitis, unspecified: Secondary | ICD-10-CM | POA: Diagnosis not present

## 2023-06-24 DIAGNOSIS — Z8249 Family history of ischemic heart disease and other diseases of the circulatory system: Secondary | ICD-10-CM | POA: Diagnosis not present

## 2023-06-24 DIAGNOSIS — H40059 Ocular hypertension, unspecified eye: Secondary | ICD-10-CM | POA: Diagnosis not present

## 2023-06-24 DIAGNOSIS — Z823 Family history of stroke: Secondary | ICD-10-CM | POA: Diagnosis not present

## 2023-06-24 DIAGNOSIS — Z818 Family history of other mental and behavioral disorders: Secondary | ICD-10-CM | POA: Diagnosis not present

## 2023-06-25 DIAGNOSIS — E039 Hypothyroidism, unspecified: Secondary | ICD-10-CM | POA: Diagnosis not present

## 2023-06-25 DIAGNOSIS — E7800 Pure hypercholesterolemia, unspecified: Secondary | ICD-10-CM | POA: Diagnosis not present

## 2023-06-25 DIAGNOSIS — E119 Type 2 diabetes mellitus without complications: Secondary | ICD-10-CM | POA: Diagnosis not present

## 2023-06-25 DIAGNOSIS — I10 Essential (primary) hypertension: Secondary | ICD-10-CM | POA: Diagnosis not present

## 2023-06-25 DIAGNOSIS — E559 Vitamin D deficiency, unspecified: Secondary | ICD-10-CM | POA: Diagnosis not present

## 2023-07-02 DIAGNOSIS — E039 Hypothyroidism, unspecified: Secondary | ICD-10-CM | POA: Diagnosis not present

## 2023-07-02 DIAGNOSIS — E7800 Pure hypercholesterolemia, unspecified: Secondary | ICD-10-CM | POA: Diagnosis not present

## 2023-07-02 DIAGNOSIS — I10 Essential (primary) hypertension: Secondary | ICD-10-CM | POA: Diagnosis not present

## 2023-07-02 DIAGNOSIS — E119 Type 2 diabetes mellitus without complications: Secondary | ICD-10-CM | POA: Diagnosis not present

## 2023-07-02 DIAGNOSIS — R946 Abnormal results of thyroid function studies: Secondary | ICD-10-CM | POA: Diagnosis not present

## 2023-07-02 DIAGNOSIS — E559 Vitamin D deficiency, unspecified: Secondary | ICD-10-CM | POA: Diagnosis not present

## 2023-07-02 DIAGNOSIS — R7309 Other abnormal glucose: Secondary | ICD-10-CM | POA: Diagnosis not present

## 2023-07-04 DIAGNOSIS — H30043 Focal chorioretinal inflammation, macular or paramacular, bilateral: Secondary | ICD-10-CM | POA: Diagnosis not present

## 2023-08-04 DIAGNOSIS — H40051 Ocular hypertension, right eye: Secondary | ICD-10-CM | POA: Diagnosis not present

## 2023-09-26 DIAGNOSIS — H35353 Cystoid macular degeneration, bilateral: Secondary | ICD-10-CM | POA: Diagnosis not present

## 2023-09-26 DIAGNOSIS — H401130 Primary open-angle glaucoma, bilateral, stage unspecified: Secondary | ICD-10-CM | POA: Diagnosis not present

## 2023-09-26 DIAGNOSIS — H30043 Focal chorioretinal inflammation, macular or paramacular, bilateral: Secondary | ICD-10-CM | POA: Diagnosis not present

## 2023-10-24 DIAGNOSIS — H30043 Focal chorioretinal inflammation, macular or paramacular, bilateral: Secondary | ICD-10-CM | POA: Diagnosis not present

## 2023-10-31 DIAGNOSIS — H35353 Cystoid macular degeneration, bilateral: Secondary | ICD-10-CM | POA: Diagnosis not present

## 2023-10-31 DIAGNOSIS — H30043 Focal chorioretinal inflammation, macular or paramacular, bilateral: Secondary | ICD-10-CM | POA: Diagnosis not present

## 2023-11-10 IMAGING — CR DG CHEST 2V
2 series · 2 of 2 positions shown · non-contrast
Comparison: 12/13/2014

CLINICAL DATA: Swelling back of eyes, hx sarcoidosis, non smoker

EXAM:
CHEST - 2 VIEW

[w chest pa]
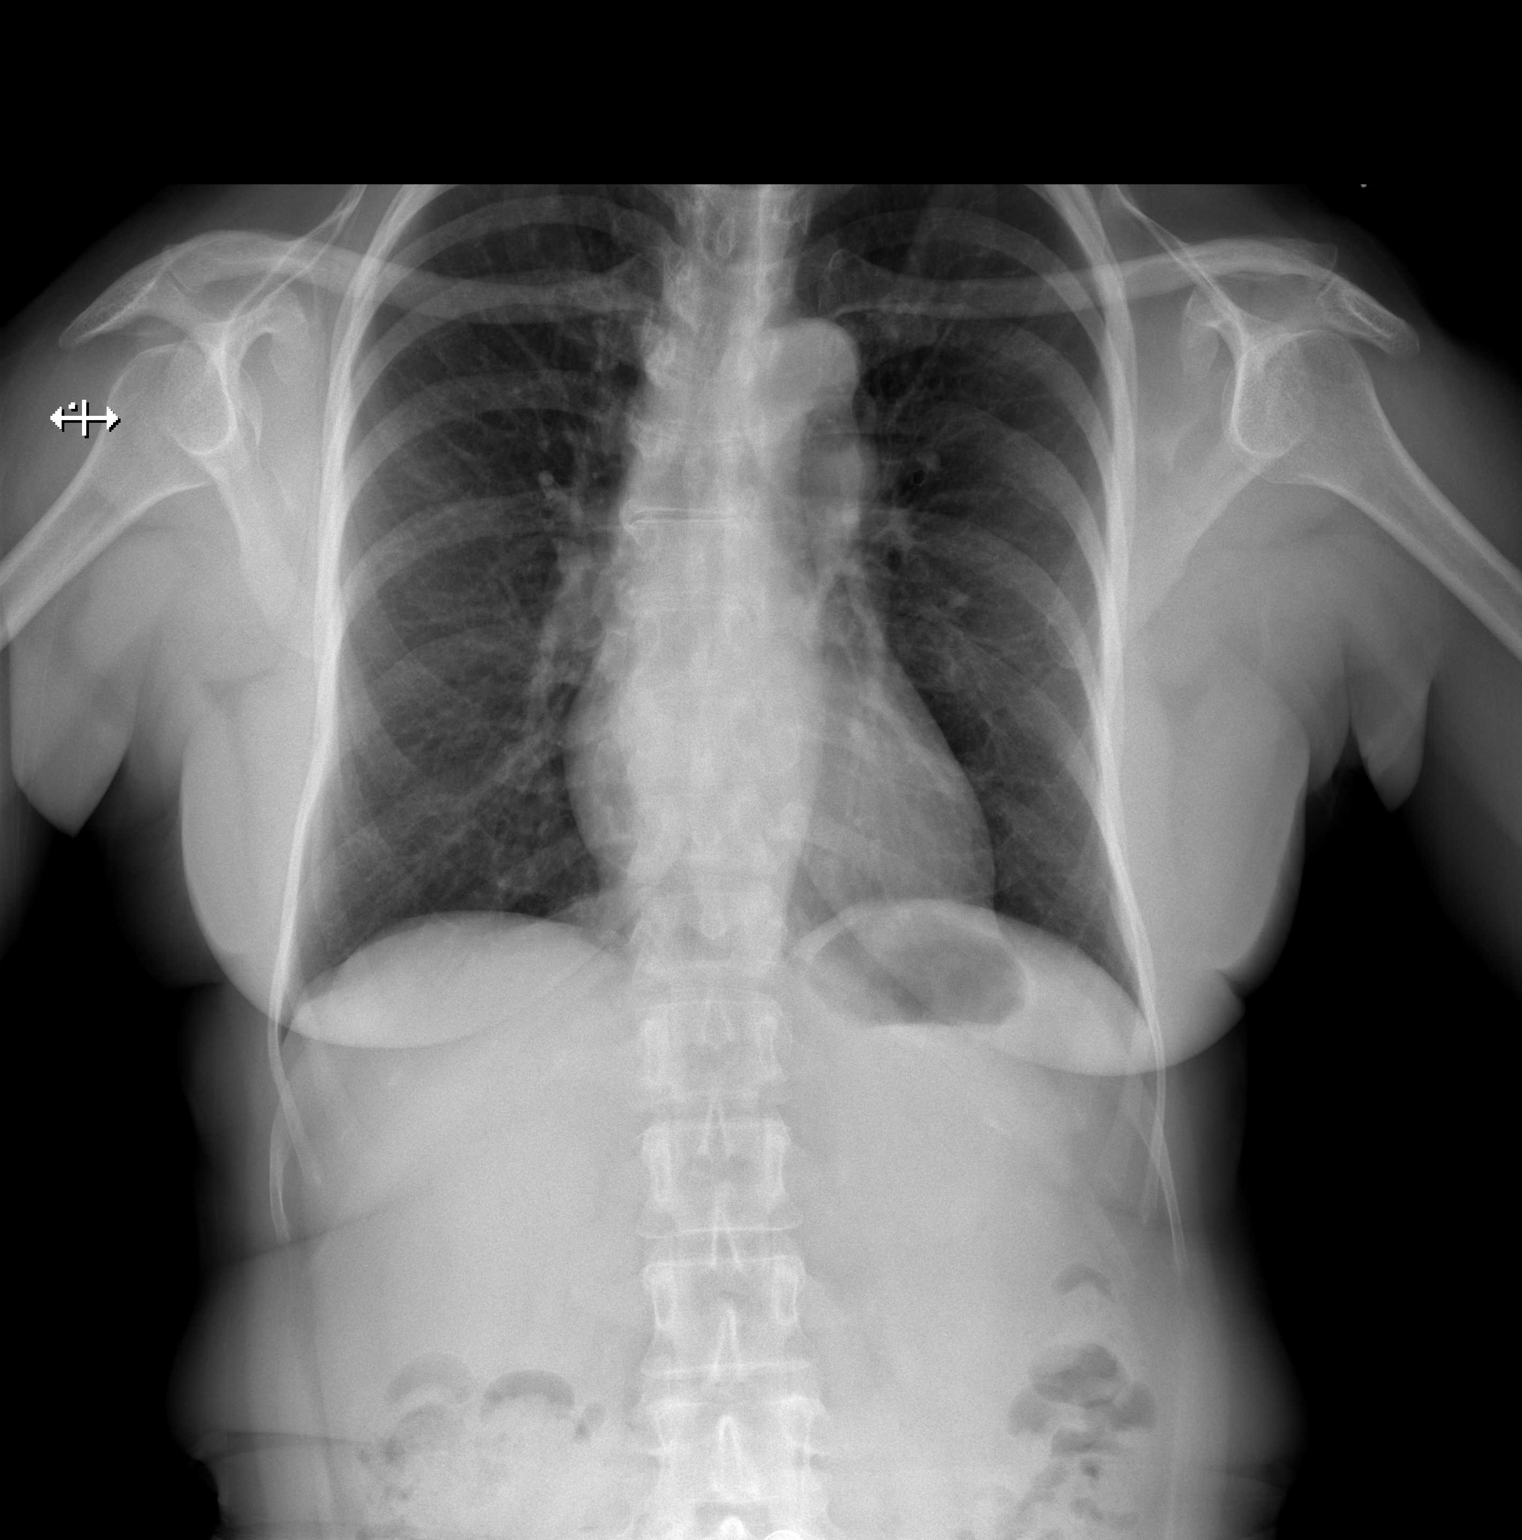

[w chest lat]
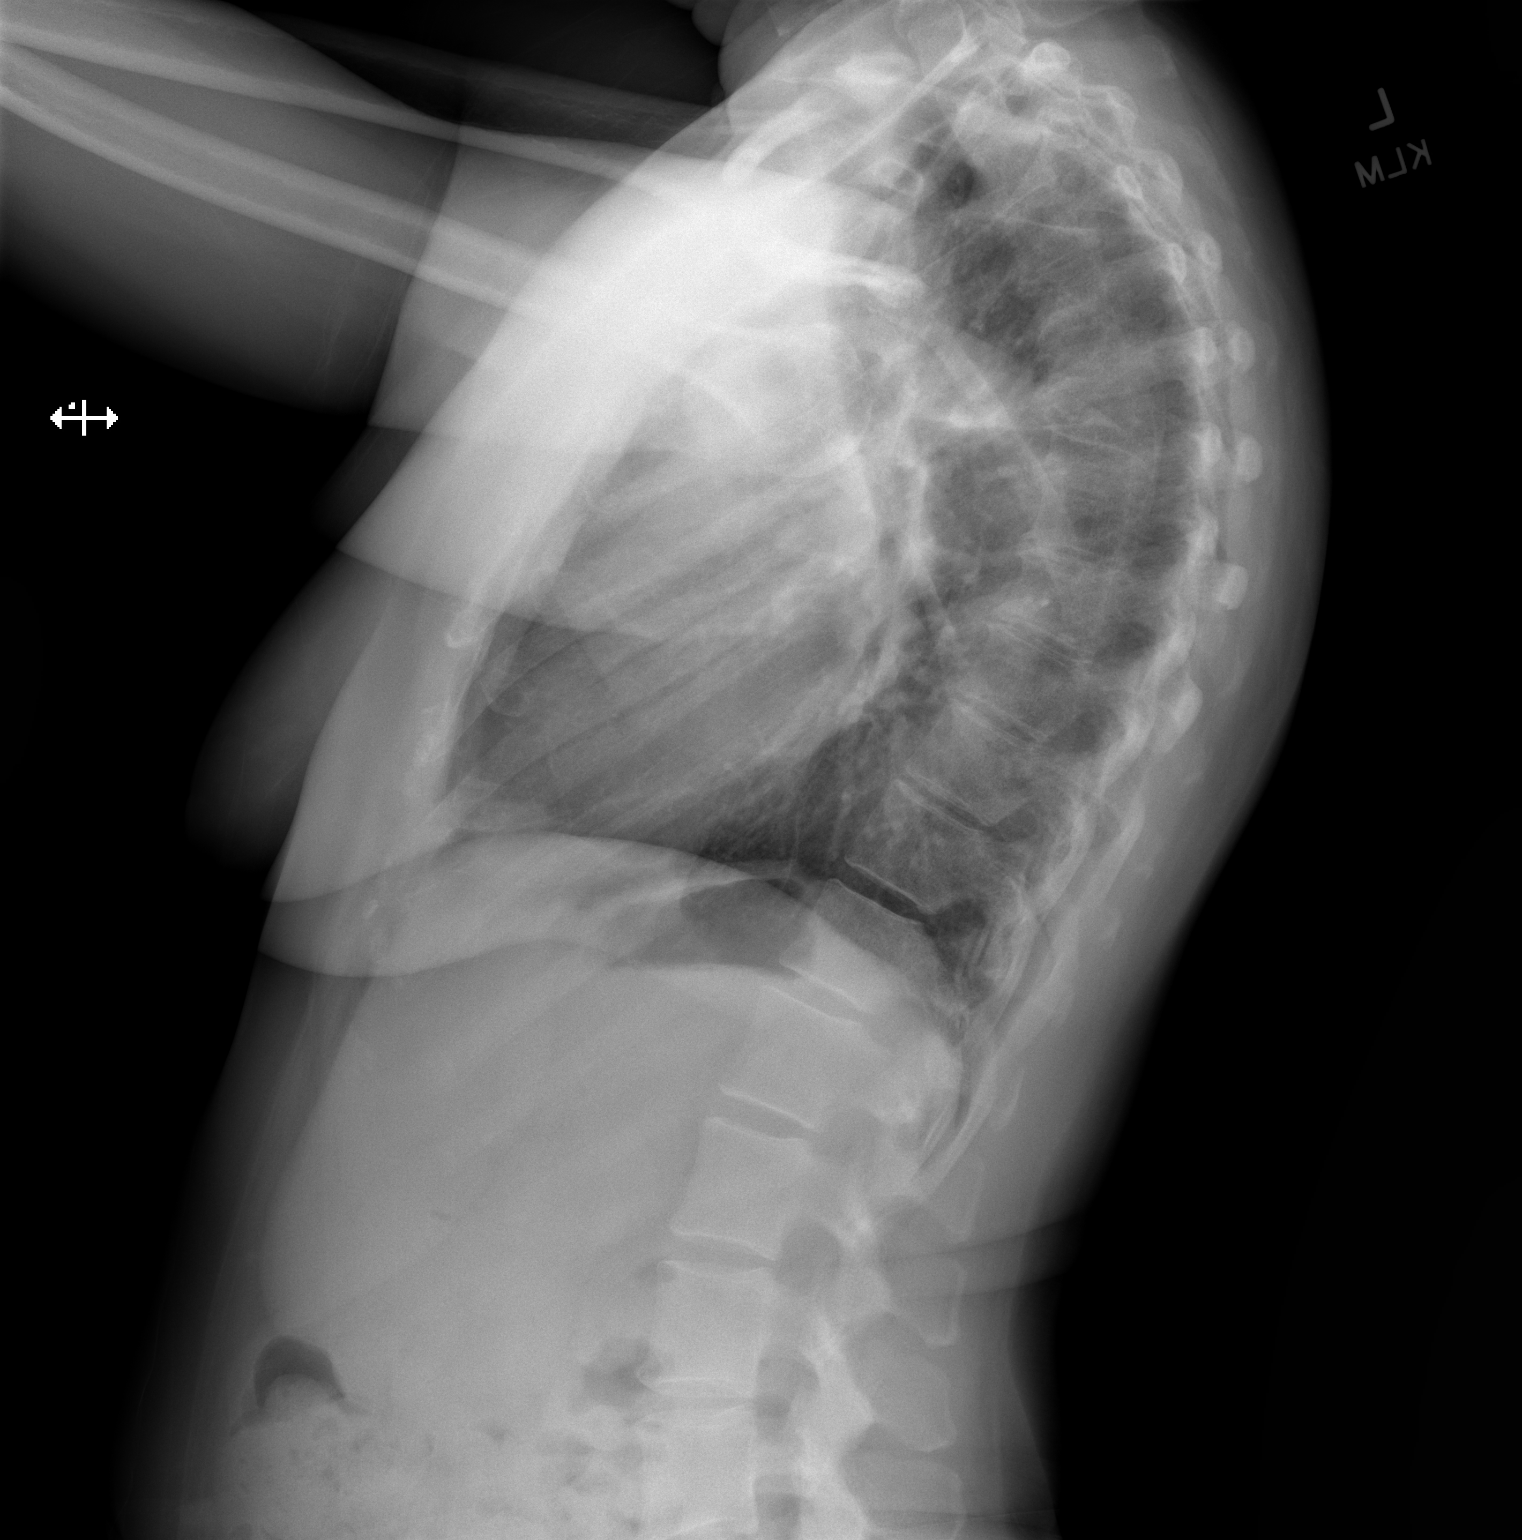

[2 of 2 positions shown; findings below may reference images not displayed]

FINDINGS: Lungs are clear.

Heart size normal.  Fullness in the aortopulmonary window as before.

No effusion.

Visualized bones unremarkable.
IMPRESSION: No acute cardiopulmonary disease.

## 2023-11-11 DIAGNOSIS — H40053 Ocular hypertension, bilateral: Secondary | ICD-10-CM | POA: Diagnosis not present

## 2023-11-17 IMAGING — XA DG SPINAL PUNCT LUMBAR DIAG WITH FL CT GUIDANCE
1 series · 1 of 1 positions shown · non-contrast
Comparison: none

CLINICAL DATA: Optic disc edema

[Series 1: ortho adipose · 1 of 1 slices shown]
[im 1/1]
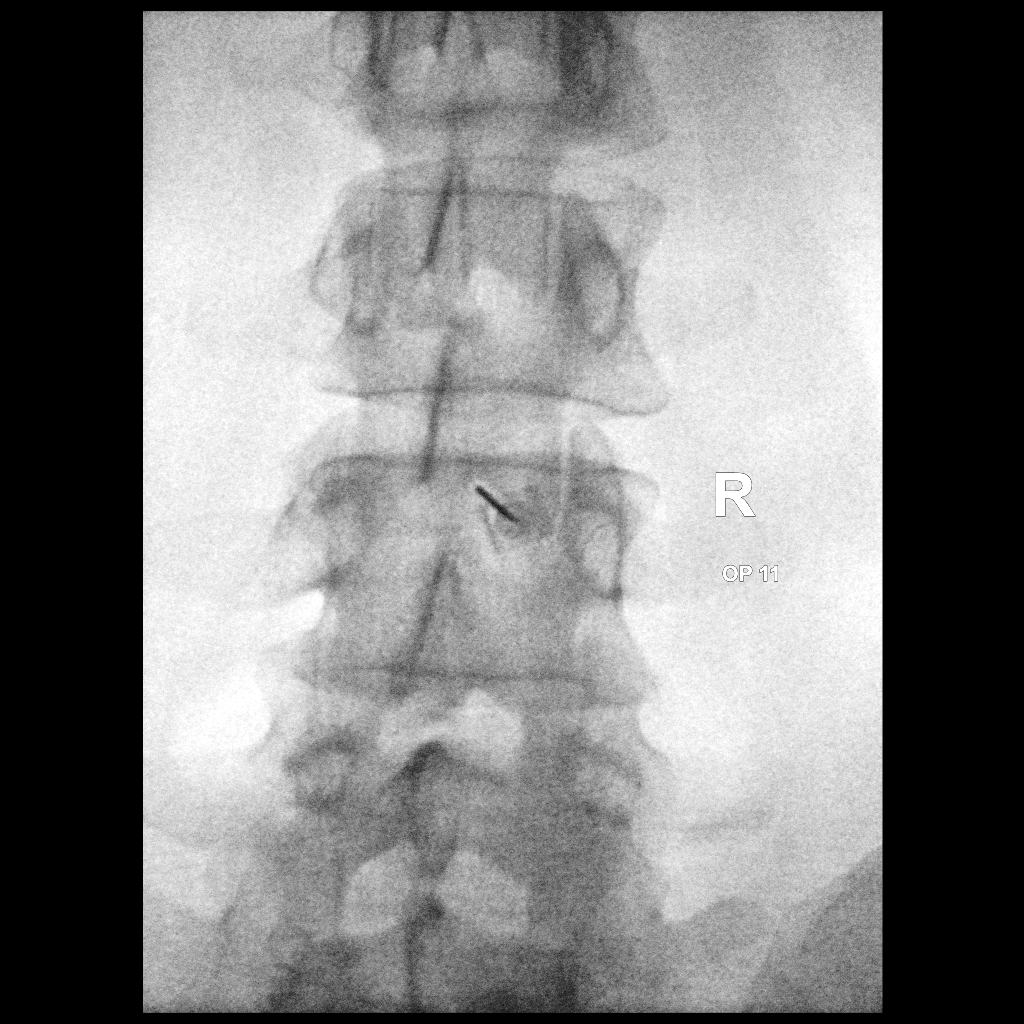

[1 of 1 positions shown; findings below may reference images not displayed]

EXAM:
DIAGNOSTIC LUMBAR PUNCTURE UNDER FLUOROSCOPIC GUIDANCE

FLUOROSCOPY TIME:  Radiation Exposure Index (as provided by the
fluoroscopic device): 0.50 mGy air Kerma

PROCEDURE:
Informed consent was obtained from the patient prior to the
procedure, including potential complications of headache, allergy,
and pain. With the patient prone, the lower back was prepped with
Betadine. 1% Lidocaine was used for local anesthesia. Lumbar
puncture was performed at the L3-4 level from a right parasagittal
approach using a 20 gauge needle with return of clear colorless CSF
with an opening pressure of 11 cm water. 6 ml of CSF were obtained
for laboratory studies. The patient tolerated the procedure well and
there were no apparent complications.
IMPRESSION: Technically successful lumbar puncture under fluoroscopy. Normal
opening pressure.

## 2023-12-03 DIAGNOSIS — H35353 Cystoid macular degeneration, bilateral: Secondary | ICD-10-CM | POA: Diagnosis not present

## 2023-12-03 DIAGNOSIS — H401130 Primary open-angle glaucoma, bilateral, stage unspecified: Secondary | ICD-10-CM | POA: Diagnosis not present

## 2023-12-03 DIAGNOSIS — H30043 Focal chorioretinal inflammation, macular or paramacular, bilateral: Secondary | ICD-10-CM | POA: Diagnosis not present

## 2023-12-26 DIAGNOSIS — H40053 Ocular hypertension, bilateral: Secondary | ICD-10-CM | POA: Diagnosis not present

## 2023-12-26 DIAGNOSIS — H40052 Ocular hypertension, left eye: Secondary | ICD-10-CM | POA: Diagnosis not present

## 2023-12-31 DIAGNOSIS — H43811 Vitreous degeneration, right eye: Secondary | ICD-10-CM | POA: Diagnosis not present

## 2024-01-01 DIAGNOSIS — Z0100 Encounter for examination of eyes and vision without abnormal findings: Secondary | ICD-10-CM | POA: Diagnosis not present

## 2024-01-26 DIAGNOSIS — E119 Type 2 diabetes mellitus without complications: Secondary | ICD-10-CM | POA: Diagnosis not present

## 2024-01-26 DIAGNOSIS — R946 Abnormal results of thyroid function studies: Secondary | ICD-10-CM | POA: Diagnosis not present

## 2024-01-26 DIAGNOSIS — E039 Hypothyroidism, unspecified: Secondary | ICD-10-CM | POA: Diagnosis not present

## 2024-01-26 DIAGNOSIS — R7309 Other abnormal glucose: Secondary | ICD-10-CM | POA: Diagnosis not present

## 2024-01-26 DIAGNOSIS — E559 Vitamin D deficiency, unspecified: Secondary | ICD-10-CM | POA: Diagnosis not present

## 2024-01-26 DIAGNOSIS — Z13220 Encounter for screening for lipoid disorders: Secondary | ICD-10-CM | POA: Diagnosis not present

## 2024-01-27 DIAGNOSIS — H40053 Ocular hypertension, bilateral: Secondary | ICD-10-CM | POA: Diagnosis not present

## 2024-02-02 DIAGNOSIS — D869 Sarcoidosis, unspecified: Secondary | ICD-10-CM | POA: Diagnosis not present

## 2024-02-02 DIAGNOSIS — E118 Type 2 diabetes mellitus with unspecified complications: Secondary | ICD-10-CM | POA: Diagnosis not present

## 2024-02-02 DIAGNOSIS — Z79899 Other long term (current) drug therapy: Secondary | ICD-10-CM | POA: Diagnosis not present

## 2024-02-02 DIAGNOSIS — Z0000 Encounter for general adult medical examination without abnormal findings: Secondary | ICD-10-CM | POA: Diagnosis not present

## 2024-02-02 DIAGNOSIS — R413 Other amnesia: Secondary | ICD-10-CM | POA: Diagnosis not present

## 2024-02-02 DIAGNOSIS — H4710 Unspecified papilledema: Secondary | ICD-10-CM | POA: Diagnosis not present

## 2024-02-02 DIAGNOSIS — E7800 Pure hypercholesterolemia, unspecified: Secondary | ICD-10-CM | POA: Diagnosis not present

## 2024-02-02 DIAGNOSIS — Z9109 Other allergy status, other than to drugs and biological substances: Secondary | ICD-10-CM | POA: Diagnosis not present

## 2024-02-02 DIAGNOSIS — Z820 Family history of epilepsy and other diseases of the nervous system: Secondary | ICD-10-CM | POA: Diagnosis not present

## 2024-02-02 DIAGNOSIS — I10 Essential (primary) hypertension: Secondary | ICD-10-CM | POA: Diagnosis not present

## 2024-02-03 DIAGNOSIS — H35353 Cystoid macular degeneration, bilateral: Secondary | ICD-10-CM | POA: Diagnosis not present

## 2024-02-03 DIAGNOSIS — H30043 Focal chorioretinal inflammation, macular or paramacular, bilateral: Secondary | ICD-10-CM | POA: Diagnosis not present

## 2024-02-03 DIAGNOSIS — H401130 Primary open-angle glaucoma, bilateral, stage unspecified: Secondary | ICD-10-CM | POA: Diagnosis not present

## 2024-02-03 DIAGNOSIS — H318 Other specified disorders of choroid: Secondary | ICD-10-CM | POA: Diagnosis not present

## 2024-02-17 DIAGNOSIS — H401134 Primary open-angle glaucoma, bilateral, indeterminate stage: Secondary | ICD-10-CM | POA: Diagnosis not present

## 2024-02-17 DIAGNOSIS — H43813 Vitreous degeneration, bilateral: Secondary | ICD-10-CM | POA: Diagnosis not present

## 2024-02-17 DIAGNOSIS — H30043 Focal chorioretinal inflammation, macular or paramacular, bilateral: Secondary | ICD-10-CM | POA: Diagnosis not present

## 2024-02-17 DIAGNOSIS — H35353 Cystoid macular degeneration, bilateral: Secondary | ICD-10-CM | POA: Diagnosis not present

## 2024-03-09 DIAGNOSIS — H40053 Ocular hypertension, bilateral: Secondary | ICD-10-CM | POA: Diagnosis not present

## 2024-03-10 DIAGNOSIS — H30043 Focal chorioretinal inflammation, macular or paramacular, bilateral: Secondary | ICD-10-CM | POA: Diagnosis not present

## 2024-04-13 ENCOUNTER — Ambulatory Visit: Payer: Medicare HMO | Admitting: Neurology

## 2024-04-13 ENCOUNTER — Encounter: Payer: Self-pay | Admitting: Neurology

## 2024-04-13 VITALS — BP 140/100 | HR 97 | Ht 62.0 in | Wt 137.0 lb

## 2024-04-13 DIAGNOSIS — G3184 Mild cognitive impairment, so stated: Secondary | ICD-10-CM

## 2024-04-13 DIAGNOSIS — E538 Deficiency of other specified B group vitamins: Secondary | ICD-10-CM

## 2024-04-13 DIAGNOSIS — Z820 Family history of epilepsy and other diseases of the nervous system: Secondary | ICD-10-CM | POA: Diagnosis not present

## 2024-04-13 DIAGNOSIS — D869 Sarcoidosis, unspecified: Secondary | ICD-10-CM

## 2024-04-13 DIAGNOSIS — R7303 Prediabetes: Secondary | ICD-10-CM

## 2024-04-13 DIAGNOSIS — R413 Other amnesia: Secondary | ICD-10-CM

## 2024-04-13 NOTE — Progress Notes (Signed)
 [facility]  PATIENT: Maria [name] DOB: Sep 17, 1956  REFERRING DOCTOR OR PCP: Pete Brand, DO (PCP); Linard Reno, MD, Ronni Colace, MD (ophthalmology)    SOURCE: Patient, notes from Dr. Lenna Quinton, notes from pulmonology, imaging and laboratory reports, MRI images personally reviewed.  _________________________________   HISTORICAL  CHIEF COMPLAINT:  Chief Complaint  Patient presents with   New Patient (Initial Visit)    Pt in 10  Pt states some memory loss Pt states increased stree Pt states 2 of her sister passed .     HISTORY OF PRESENT ILLNESS:  Maria [name] is a 68 y.o. woman with h/o optic nerve edema, visual changes and history of sarcoidosis.  She is now referred back for memory concerns  Update 04/13/2024 She states vision improved after her last visit.  She is getting injections into her eye. She has bilateral optic disk edema.     LP showed normal OP.   CSF was normal.   ACE, bartonella, RPR were negative.     She has noted more issues to her memory.  She feels stress is playing a role as she was not driving x 2 years due to vision and losing 2 sisters and probate issues.     She is driving again and she hopes that helps.  She denies feelings of depression but notes mild anxiety.   She is sleeping well.   She has been told she snores but never that she has any OSA signs.     She states her family has noted she repeats herself.      BP is elevated today.  She is on hydrochlorothiazide and she took this am.     Recent labs show normal or non-contributary: Vit D, TSH, CBC, CMP.   HgbA1c slightly elevated at 6.4.    MRI brain 01/2022 showed no significant atrophy and negligible white matter changes.        04/13/2024    8:21 AM  Montreal Cognitive Assessment   Visuospatial/ Executive (0/5) 3  Naming (0/3) 3  Attention: Read list of digits (0/2) 1  Attention: Read list of letters (0/1) 1  Attention: Serial 7 subtraction starting at 100  (0/3) 1  Language: Repeat phrase (0/2) 1  Language : Fluency (0/1) 1  Abstraction (0/2) 1  Delayed Recall (0/5) 1  Orientation (0/6) 6  Total 19   Notes on MoCA:  Short term recall was 1/5 with no hints, and 2/5 with category hints.     She states always poor at math:  WORLD - DLROW   History of visual symptoms She had cataract surgery in October 2022.   Initially, she felt she recovered well.  However, she had the onset of blurry vision December 2023.   She saw ophthalmology and was noted to havee optic nerve edema bilaterally.   Vision changes were painless.    She denies any headache, jaw claudication.   BP is normal most times.  Was mildly elevated at check-in today.     She had sarcoidosis (pulmonary only) in her 15's and 34's.   She ever needed to be on medication and was d/c form pulmonology about 10 years ago .   Ophthalmology exam2/28/2023 (Dr. Lenna Quinton) was reviewed.  It showed visual acuity was OS 20/80; OD 20/80.   Aaron Aas   She had bilateral disc edema, confirmed on OCT RNFL.    The macula was fine.   IOP was elevated 26/28.  She received intraocular steroids and is on  Dorzolamide/timolol eyedrops.    She has noted mild improvement and can now see the TV better.    Colors are not distorted.  IMAGING/LABS personally reviewed: MRI of the head and orbits 02/19/2022 shows a single punctate T2/flair hyperintense focus in the right frontoparietal subcortical white matter.  Brain volume is normal for age.  No acute findings.  Normal enhancement pattern.  The orbit is normal.  The single focus is nonspecific, likely due to very minimal chronic microvascular ischemic change that is normal for age.  02/19/2022:  ESR = 35    CRP was 2.8   Bartonella negative.   CBC/BMP normal.     REVIEW OF SYSTEMS: Constitutional: No fevers, chills, sweats, or change in appetite Eyes: As above.   Ear, nose and throat: No hearing loss, ear pain, nasal congestion, sore throat Cardiovascular: No chest pain,  palpitations Respiratory:  No shortness of breath at rest or with exertion.   No wheezes GastrointestinaI: No nausea, vomiting, diarrhea, abdominal pain, fecal incontinence Genitourinary:  No dysuria, urinary retention or frequency.  No nocturia. Musculoskeletal:  No neck pain, back pain Integumentary: No rash, pruritus, skin lesions Neurological: as above Psychiatric: No depression at this time.  No anxiety Endocrine: No palpitations, diaphoresis, change in appetite, change in weigh or increased thirst Hematologic/Lymphatic:  No anemia, purpura, petechiae. Allergic/Immunologic: No itchy/runny eyes, nasal congestion, recent allergic reactions, rashes  ALLERGIES: Allergies  Allergen Reactions   Penicillins Rash    Has patient had a PCN reaction causing immediate rash, facial/tongue/throat swelling, SOB or lightheadedness with hypotension: No Has patient had a PCN reaction causing severe rash involving mucus membranes or skin necrosis: No Has patient had a PCN reaction that required hospitalization: No Has patient had a PCN reaction occurring within the last 10 years: No If all of the above answers are "NO", then may proceed with Cephalosporin use.     HOME MEDICATIONS:  Current Outpatient Medications:    dorzolamide-timolol (COSOPT) 2-0.5 % ophthalmic solution, SMARTSIG:In Eye(s), Disp: , Rfl:    hydrochlorothiazide (HYDRODIURIL) 12.5 MG tablet, Take 12.5 mg by mouth daily. , Disp: , Rfl:    levothyroxine (SYNTHROID) 50 MCG tablet, Take 1 tablet by mouth. Daily, except 2 tablets on Saturday and Sunday, Disp: , Rfl:    loratadine (CLARITIN) 10 MG tablet, Take 10 mg by mouth daily as needed for allergies., Disp: , Rfl:    omeprazole (PRILOSEC OTC) 20 MG tablet, Take 20 mg by mouth as needed., Disp: , Rfl:    rosuvastatin (CRESTOR) 10 MG tablet, Take 20 mg by mouth daily., Disp: , Rfl:    brimonidine (ALPHAGAN) 0.15 % ophthalmic solution, Place 1 drop into both eyes in the morning and at  bedtime., Disp: , Rfl:    timolol (TIMOPTIC) 0.5 % ophthalmic solution, Apply 1 drop to eye., Disp: , Rfl:   PAST MEDICAL HISTORY: Past Medical History:  Diagnosis Date   GERD (gastroesophageal reflux disease)    Hyperlipidemia    Hypertension    Hypothyroidism    Pulmonary sarcoidosis (HCC)    Sarcoidosis    Viral URI     PAST SURGICAL HISTORY: Past Surgical History:  Procedure Laterality Date   ABDOMINAL HYSTERECTOMY     BUNIONECTOMY Bilateral    COLONOSCOPY  2007   MASS EXCISION Bilateral 05/15/2021   Procedure: Excision of bilateral accessory axillary breast tissue;  Surgeon: Barb Bonito, MD;  Location:  SUR[facility]ERY C[facility];  Service: Plastics;  Laterality: Bilateral;  1 hour   mediastinal  node biopsy     dx with sarcoid   MINOR CARPAL TUNNEL Right 03/01/2016   Procedure: RIGHT CARPAL TUNNEL RELEASE;  Surgeon: Ronn Cohn, MD;  Location: [facility];  Service: Orthopedics;  Laterality: Right;    FAMILY HISTORY: Family History  Problem Relation Age of Onset   Heart disease Mother    Alzheimer's disease Mother    Heart disease Father    Breast cancer Sister    Colon cancer Neg Hx    Esophageal cancer Neg Hx    Rectal cancer Neg Hx    Stomach cancer Neg Hx    Colon polyps Neg Hx     SOCIAL HISTORY:  Social History   Socioeconomic History   Marital status: Divorced    Spouse name: Not on file   Number of children: 2   Years of education: Not on file   Highest education level: Not on file  Occupational History   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  Tobacco Use   Smoking status: Never   Smokeless tobacco: Never  Vaping Use   Vaping status: Never Used  Substance and Sexual Activity   Alcohol use: No   Drug use: No   Sexual activity: Not on file  Other Topics Concern   Not on file  Social History Narrative   Pt lives alone    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    General: The patient is well-developed and well-nourished and in no acute distress  HEENT:  Head is [place]/AT.  Sclera are anicteric.  Funduscopic examination difficult due to small pupils (on drops).  I did not get good view of disc.  Neck: No carotid bruits are noted.  The neck is nontender.  Cardiovascular: The heart has a regular rate and rhythm with a normal S1 and S2. There were no murmurs, gallops or rubs.    Skin: Extremities are without rash or  edema.  Musculoskeletal:  Back is nontender  Neurologic Exam  Mental status: The patient is alert and oriented x 3 at the time of the examination. The patient has reduced recent and and some reduced attention span and concentration ability.   Speech is normal.  Cranial nerves: Extraocular movements are full.  Color vision was symmetric.  Facial symmetry is present. There is good facial sensation to soft touch bilaterally.Facial strength is normal.  Trapezius and sternocleidomastoid strength is normal. No dysarthria is noted.  The tongue is midline, and the patient has symmetric elevation of the soft palate. No obvious hearing deficits are noted.  Motor:  Muscle bulk is normal.   Tone is normal. Strength is  5 / 5 in all 4 extremities.   Sensory: Sensory testing is intact to pinprick, soft touch and vibration sensation in all 4 extremities.  Coordination: Cerebellar testing reveals good finger-nose-finger and heel-to-shin bilaterally.  Gait and station: Station is normal.   Gait is normal. Tandem gait is normal for age. Romberg is  negative.   Reflexes: Deep tendon  reflexes are symmetric and normal bilaterally.      DIAGNOSTIC DATA (LABS, IMAGING, TESTING) - I reviewed patient records, labs, notes, testing and imaging myself where available.  Lab Results  Component Value Date   WBC 7.8 02/19/2022   HGB 14.0 02/19/2022   HCT 43.7 02/19/2022   MCV 87.2 02/19/2022   PLT 294 02/19/2022      Component Value Date/Time   NA 139 02/19/2022 1721   K 3.6 02/19/2022 1721   CL 102 02/19/2022 1721   CO2 25 02/19/2022 1721   GLUCOSE 94 02/19/2022 1721   BUN 12 02/19/2022 1721   CREATININE 0.79 02/19/2022 1721   CALCIUM 10.2 02/19/2022 1721   PROT 8.7 (H) 12/13/2014 0950   ALBUMIN 4.1 12/13/2014 0950   AST 22 12/13/2014 0950   ALT 18 12/13/2014 0950   ALKPHOS 66 12/13/2014 0950   BILITOT 0.5 12/13/2014 0950   GFRNONAA >60 02/19/2022 1721   GFRAA >60 02/01/2016 0830      ASSESSMENT AND PLAN  Amnestic mild cognitive disorder - Plan: MR BRAIN WO CONTRAST, ATN PROFILE, Vitamin B12, APOE Alzheimer's Risk  Memory loss - Plan: MR BRAIN WO CONTRAST, ATN PROFILE, Vitamin B12, APOE Alzheimer's Risk  B12 deficiency - Plan: Vitamin B12  Family history of Alzheimer disease - Plan: APOE Alzheimer's Risk  SARCOIDOSIS, PULMONARY  Pre-diabetes   Check B12, AD biomarkers (AB 42/40 ratio, [id number] and NfL).   Check MRI brain without contrast Consider donepezil based on results.    Might also consider  COntinue to see ophthalmology for eye issues.   Rtc 6 months  This visit is part of a comprehensive longitudinal care medical relationship regarding the patients primary diagnosis of amnestic MCI and related concerns.   Elicia Lui A. Godwin Lat, MD, [facility] 04/13/2024, 9:01 AM Certified in Neurology, Clinical Neurophysiology, Sleep Medicine and Neuroimaging  [facility] [address] [place], [place] [address] [phone]

## 2024-04-14 ENCOUNTER — Telehealth: Payer: Self-pay | Admitting: Neurology

## 2024-04-14 DIAGNOSIS — I10 Essential (primary) hypertension: Secondary | ICD-10-CM | POA: Diagnosis not present

## 2024-04-14 NOTE — Telephone Encounter (Signed)
 sent to GI they obtain [facility] Berkley Harvey [phone]

## 2024-04-16 ENCOUNTER — Encounter: Payer: Self-pay | Admitting: Neurology

## 2024-04-20 ENCOUNTER — Encounter: Payer: Self-pay | Admitting: Neurology

## 2024-04-20 DIAGNOSIS — H40053 Ocular hypertension, bilateral: Secondary | ICD-10-CM | POA: Diagnosis not present

## 2024-04-21 ENCOUNTER — Ambulatory Visit
Admission: RE | Admit: 2024-04-21 | Discharge: 2024-04-21 | Disposition: A | Discharge status: Home / Self Care | Source: Ambulatory Visit | Attending: Neurology | Admitting: Neurology

## 2024-04-21 ENCOUNTER — Encounter: Payer: Self-pay | Admitting: Neurology

## 2024-04-21 DIAGNOSIS — R413 Other amnesia: Secondary | ICD-10-CM

## 2024-04-21 DIAGNOSIS — G3184 Mild cognitive impairment, so stated: Secondary | ICD-10-CM | POA: Diagnosis not present

## 2024-04-21 LAB — APOE ALZHEIMER'S RISK

## 2024-04-21 LAB — ATN PROFILE
A -- Beta-amyloid 42/40 Ratio: 0.095 — ABNORMAL LOW (ref >0.102)
Beta-amyloid 40: 162.29 pg/mL
Beta-amyloid 42: 15.34 pg/mL
N -- NfL, Plasma: 2.36 pg/mL (ref 0.00–4.61)
T -- p-tau181: 0.6 pg/mL (ref 0.00–0.97)

## 2024-04-21 LAB — VITAMIN B12: Vitamin B-12: >2000 pg/mL — ABNORMAL HIGH (ref 232–1245)

## 2024-04-22 ENCOUNTER — Telehealth: Payer: Self-pay | Admitting: *Deleted

## 2024-04-22 DIAGNOSIS — R413 Other amnesia: Secondary | ICD-10-CM

## 2024-04-22 MED ORDER — DONEPEZIL HCL 5 MG PO TABS: 5.0000 mg | ORAL_TABLET | Freq: Every day | ORAL | 0 refills | Status: DC

## 2024-04-22 NOTE — Telephone Encounter (Signed)
-----   Message from Jorie Newness sent at 04/22/2024  8:27 AM EDT ----- Please let them know that the amyloid biomarker we checked did show a ratio that would be consistent with mild Alzheimer's disease.   At the last visit we started donepezil .   1.  We had talked a little bit about the amyloid drugs.  If they potentially have an interest, then I would need to check a PET scan to definitively diagnose Alzheimer's. 2.  Either way, if she is tolerating the low-dose of donepezil  we can send in a prescription for 10 mg daily

## 2024-04-22 NOTE — Telephone Encounter (Signed)
 Called and spoke with pt about results per Dr Thom Fleeting note. Pt verbalized understanding. Rx donepezil  was not sent. Explained to pt to take 5mg  po every day for a month. If tolerating well, to let us  know and we will send in 10mg  after that. Went over common SE.   She is not interested in starting amyloid drugs at this time. She will hold off on PET scan at this time. She will keep upcoming f/u 10/2024 and will call if she develops any new or worsening sx prior to then.

## 2024-04-23 ENCOUNTER — Other Ambulatory Visit

## 2024-05-05 DIAGNOSIS — H43813 Vitreous degeneration, bilateral: Secondary | ICD-10-CM | POA: Diagnosis not present

## 2024-05-05 DIAGNOSIS — H35353 Cystoid macular degeneration, bilateral: Secondary | ICD-10-CM | POA: Diagnosis not present

## 2024-05-05 DIAGNOSIS — H30043 Focal chorioretinal inflammation, macular or paramacular, bilateral: Secondary | ICD-10-CM | POA: Diagnosis not present

## 2024-05-05 DIAGNOSIS — H401134 Primary open-angle glaucoma, bilateral, indeterminate stage: Secondary | ICD-10-CM | POA: Diagnosis not present

## 2024-05-14 ENCOUNTER — Other Ambulatory Visit: Payer: Self-pay | Admitting: Neurology

## 2024-05-14 DIAGNOSIS — R413 Other amnesia: Secondary | ICD-10-CM

## 2024-05-18 DIAGNOSIS — I10 Essential (primary) hypertension: Secondary | ICD-10-CM | POA: Diagnosis not present

## 2024-06-28 DIAGNOSIS — Z79899 Other long term (current) drug therapy: Secondary | ICD-10-CM | POA: Diagnosis not present

## 2024-07-05 DIAGNOSIS — E039 Hypothyroidism, unspecified: Secondary | ICD-10-CM | POA: Diagnosis not present

## 2024-07-05 DIAGNOSIS — I10 Essential (primary) hypertension: Secondary | ICD-10-CM | POA: Diagnosis not present

## 2024-07-05 DIAGNOSIS — E7800 Pure hypercholesterolemia, unspecified: Secondary | ICD-10-CM | POA: Diagnosis not present

## 2024-07-05 DIAGNOSIS — D869 Sarcoidosis, unspecified: Secondary | ICD-10-CM | POA: Diagnosis not present

## 2024-07-05 DIAGNOSIS — E210 Primary hyperparathyroidism: Secondary | ICD-10-CM | POA: Diagnosis not present

## 2024-07-05 DIAGNOSIS — E118 Type 2 diabetes mellitus with unspecified complications: Secondary | ICD-10-CM | POA: Diagnosis not present

## 2024-07-05 DIAGNOSIS — E119 Type 2 diabetes mellitus without complications: Secondary | ICD-10-CM | POA: Diagnosis not present

## 2024-07-05 DIAGNOSIS — R7309 Other abnormal glucose: Secondary | ICD-10-CM | POA: Diagnosis not present

## 2024-07-05 DIAGNOSIS — R946 Abnormal results of thyroid function studies: Secondary | ICD-10-CM | POA: Diagnosis not present

## 2024-07-05 DIAGNOSIS — E559 Vitamin D deficiency, unspecified: Secondary | ICD-10-CM | POA: Diagnosis not present

## 2024-07-06 DIAGNOSIS — H43813 Vitreous degeneration, bilateral: Secondary | ICD-10-CM | POA: Diagnosis not present

## 2024-07-06 DIAGNOSIS — H35353 Cystoid macular degeneration, bilateral: Secondary | ICD-10-CM | POA: Diagnosis not present

## 2024-07-06 DIAGNOSIS — H401134 Primary open-angle glaucoma, bilateral, indeterminate stage: Secondary | ICD-10-CM | POA: Diagnosis not present

## 2024-07-06 DIAGNOSIS — H30043 Focal chorioretinal inflammation, macular or paramacular, bilateral: Secondary | ICD-10-CM | POA: Diagnosis not present

## 2024-08-17 DIAGNOSIS — H40053 Ocular hypertension, bilateral: Secondary | ICD-10-CM | POA: Diagnosis not present

## 2024-10-06 DIAGNOSIS — H35353 Cystoid macular degeneration, bilateral: Secondary | ICD-10-CM | POA: Diagnosis not present

## 2024-10-06 DIAGNOSIS — H43813 Vitreous degeneration, bilateral: Secondary | ICD-10-CM | POA: Diagnosis not present

## 2024-10-06 DIAGNOSIS — H30043 Focal chorioretinal inflammation, macular or paramacular, bilateral: Secondary | ICD-10-CM | POA: Diagnosis not present

## 2024-10-06 DIAGNOSIS — H401134 Primary open-angle glaucoma, bilateral, indeterminate stage: Secondary | ICD-10-CM | POA: Diagnosis not present

## 2024-11-03 DIAGNOSIS — H35353 Cystoid macular degeneration, bilateral: Secondary | ICD-10-CM | POA: Diagnosis not present

## 2024-11-03 DIAGNOSIS — H401134 Primary open-angle glaucoma, bilateral, indeterminate stage: Secondary | ICD-10-CM | POA: Diagnosis not present

## 2024-11-03 DIAGNOSIS — H43813 Vitreous degeneration, bilateral: Secondary | ICD-10-CM | POA: Diagnosis not present

## 2024-11-03 DIAGNOSIS — H30043 Focal chorioretinal inflammation, macular or paramacular, bilateral: Secondary | ICD-10-CM | POA: Diagnosis not present

## 2024-11-16 ENCOUNTER — Ambulatory Visit: Admitting: Neurology

## 2024-11-17 ENCOUNTER — Encounter: Payer: Self-pay | Admitting: Neurology

## 2024-11-17 ENCOUNTER — Ambulatory Visit: Admitting: Neurology

## 2024-11-17 VITALS — BP 196/80 | HR 112 | Resp 16 | Ht 62.5 in | Wt 137.5 lb

## 2024-11-17 DIAGNOSIS — G309 Alzheimer's disease, unspecified: Secondary | ICD-10-CM | POA: Diagnosis not present

## 2024-11-17 DIAGNOSIS — G3184 Mild cognitive impairment, so stated: Secondary | ICD-10-CM | POA: Diagnosis not present

## 2024-11-17 DIAGNOSIS — F0280 Dementia in other diseases classified elsewhere without behavioral disturbance: Secondary | ICD-10-CM

## 2024-11-17 DIAGNOSIS — Z820 Family history of epilepsy and other diseases of the nervous system: Secondary | ICD-10-CM

## 2024-11-17 NOTE — Progress Notes (Signed)
 [facility]  PATIENT: Maria [name] DOB: 30-May-1956  REFERRING DOCTOR OR PCP: Lonell Collet, DO (PCP); Selinda Slocumb, MD, Evalene Raw, MD (ophthalmology)    SOURCE: Patient, notes from Dr. Raw, notes from pulmonology, imaging and laboratory reports, MRI images personally reviewed.  _________________________________   HISTORICAL  CHIEF COMPLAINT:  Chief Complaint  Patient presents with   Memory Loss    Rm10, alone, Memory loss follow up: moca score of 18    HISTORY OF PRESENT ILLNESS:  Maria [name] is a 68 y.o. woman with h/o optic nerve edema, visual changes and history of sarcoidosis.  She is now referred back for memory concerns  Update 11/17/2024 Her AB 42/40 ratio was reduced c/w AD pathology.   She has increased her physical activity and eats well.   She is driving near her home without difficulty.     Her vision is doing better and she has not needed the intraocular injections - she had inflammation after cataract surgery including optic disc edema.   LP showed normal OP.   CSF was normal.   ACE, bartonella, RPR were negative.     She feels her memory is stable.  She states her family has noted she repeats herself.      She states others have not told her that she is worsening any     She feels stress is playing a role with vision issues losing 2 sisters and probate issues.       She denies feelings of depression but notes mild anxiety.   She is sleeping well.   She has been told she snores but never that she has any OSA signs.     BP is elevated today.  She is on hydrochlorothiazide and she took this am.     Recent labs show normal or non-contributary: Vit D, TSH, CBC, CMP.   HgbA1c slightly elevated at 6.4.    MRI brain 01/2022 showed no significant atrophy and negligible white matter changes.        11/17/2024    7:54 AM 04/13/2024    8:21 AM  Montreal Cognitive Assessment   Visuospatial/ Executive (0/5) 2 3  Naming (0/3) 2 3   Attention: Read list of digits (0/2) 1 1  Attention: Read list of letters (0/1) 1 1  Attention: Serial 7 subtraction starting at 100 (0/3) 1 1  Language: Repeat phrase (0/2) 0 1  Language : Fluency (0/1) 1 1  Abstraction (0/2) 2 1  Delayed Recall (0/5) 2 1  Orientation (0/6) 6 6  Total 18 19   Notes on MoCA:  Short term recall was 1/5 with no hints, and no better with category hints.     She states always poor at math:  WORLD - DLROW   History of visual symptoms She had cataract surgery in October 2022.   Initially, she felt she recovered well.  However, she had the onset of blurry vision December 2023.   She saw ophthalmology and was noted to havee optic nerve edema bilaterally.   Vision changes were painless.    She denies any headache, jaw claudication.   BP is normal most times.  Was mildly elevated at check-in today.     She had sarcoidosis (pulmonary only) in her 38's and 3's.   She ever needed to be on medication and was d/c form pulmonology about 10 years ago .   Ophthalmology exam2/28/2023 (Dr. Raw) was reviewed.  It showed visual acuity was OS 20/80; OD 20/80.   SABRA  She had bilateral disc edema, confirmed on OCT RNFL.    The macula was fine.   IOP was elevated 26/28.  She received intraocular steroids and is on Dorzolamide/timolol eyedrops.    She has noted mild improvement and can now see the TV better.    Colors are not distorted.  IMAGING/LABS personally reviewed: MRI of the head and orbits 02/19/2022 shows a single punctate T2/flair hyperintense focus in the right frontoparietal subcortical white matter.  Brain volume is normal for age.  No acute findings.  Normal enhancement pattern.  The orbit is normal.  The single focus is nonspecific, likely due to very minimal chronic microvascular ischemic change that is normal for age.  02/19/2022:  ESR = 35    CRP was 2.8   Bartonella negative.   CBC/BMP normal.     REVIEW OF SYSTEMS: Constitutional: No fevers, chills, sweats, or  change in appetite Eyes: As above.   Ear, nose and throat: No hearing loss, ear pain, nasal congestion, sore throat Cardiovascular: No chest pain, palpitations Respiratory:  No shortness of breath at rest or with exertion.   No wheezes GastrointestinaI: No nausea, vomiting, diarrhea, abdominal pain, fecal incontinence Genitourinary:  No dysuria, urinary retention or frequency.  No nocturia. Musculoskeletal:  No neck pain, back pain Integumentary: No rash, pruritus, skin lesions Neurological: as above Psychiatric: No depression at this time.  No anxiety Endocrine: No palpitations, diaphoresis, change in appetite, change in weigh or increased thirst Hematologic/Lymphatic:  No anemia, purpura, petechiae. Allergic/Immunologic: No itchy/runny eyes, nasal congestion, recent allergic reactions, rashes  ALLERGIES: Allergies  Allergen Reactions   Hydrochlorothiazide     Other Reaction(s): ? gout flare   Lisinopril     Other Reaction(s): cough, elevated blood pressure   Losartan Potassium     Other Reaction(s): more elevated BP   Meloxicam     Other Reaction(s): headches   Pravastatin     Other Reaction(s): Unknown   Penicillins Rash, Dermatitis and Other (See Comments)    Has patient had a PCN reaction causing immediate rash, facial/tongue/throat swelling, SOB or lightheadedness with hypotension: No  Has patient had a PCN reaction causing severe rash involving mucus membranes or skin necrosis: No  Has patient had a PCN reaction that required hospitalization: No  Has patient had a PCN reaction occurring within the last 10 years: No  If all of the above answers are NO, then may proceed with Cephalosporin use.  Other Reaction(s): Unknown    HOME MEDICATIONS:  Current Outpatient Medications:    cyanocobalamin (VITAMIN B12) 1000 MCG tablet, Take 1,000 mcg by mouth daily., Disp: , Rfl:    dorzolamide (TRUSOPT) 2 % ophthalmic solution, 1 drop 2 (two) times daily., Disp: , Rfl:     dorzolamide-timolol (COSOPT) 2-0.5 % ophthalmic solution, SMARTSIG:In Eye(s), Disp: , Rfl:    ketorolac  (ACULAR ) 0.5 % ophthalmic solution, 1 drop 4 (four) times daily., Disp: , Rfl:    levothyroxine (SYNTHROID) 50 MCG tablet, Take 1 tablet by mouth. Daily, except 2 tablets on Saturday and Sunday, Disp: , Rfl:    loratadine (CLARITIN) 10 MG tablet, Take 10 mg by mouth daily as needed for allergies., Disp: , Rfl:    prednisoLONE acetate (PRED FORTE) 1 % ophthalmic suspension, Place 1 drop into both eyes 4 (four) times daily., Disp: , Rfl:    Propylene Glycol (SYSTANE BALANCE OP), Apply 1 drop to eye in the morning, at noon, in the evening, and at bedtime., Disp: , Rfl:    rosuvastatin (  CRESTOR) 10 MG tablet, Take 20 mg by mouth daily., Disp: , Rfl:    valsartan-hydrochlorothiazide (DIOVAN-HCT) 80-12.5 MG tablet, Take 1 tablet by mouth daily., Disp: , Rfl:   PAST MEDICAL HISTORY: Past Medical History:  Diagnosis Date   GERD (gastroesophageal reflux disease)    Hyperlipidemia    Hypertension    Hypothyroidism    Pulmonary sarcoidosis    Sarcoidosis    Viral URI     PAST SURGICAL HISTORY: Past Surgical History:  Procedure Laterality Date   ABDOMINAL HYSTERECTOMY     BUNIONECTOMY Bilateral    COLONOSCOPY  2007   MASS EXCISION Bilateral 05/15/2021   Procedure: Excision of bilateral accessory axillary breast tissue;  Surgeon: Elisabeth Craig RAMAN, MD;  Location: [facility];  Service: Plastics;  Laterality: Bilateral;  1 hour   mediastinal node biopsy     dx with sarcoid   MINOR CARPAL TUNNEL Right 03/01/2016   Procedure: RIGHT CARPAL TUNNEL RELEASE;  Surgeon: Elsie Mussel, MD;  Location: [facility];  Service: Orthopedics;  Laterality: Right;    FAMILY HISTORY: Family History  Problem Relation Age of Onset   Heart disease Mother    Alzheimer's disease Mother    Heart disease Father    Breast cancer Sister    Colon cancer Neg Hx    Esophageal cancer Neg Hx     Rectal cancer Neg Hx    Stomach cancer Neg Hx    Colon polyps Neg Hx     SOCIAL HISTORY:  Social History   Socioeconomic History   Marital status: Divorced    Spouse name: Not on file   Number of children: 2   Years of education: Not on file   Highest education level: Not on file  Occupational History   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  Tobacco Use   Smoking status: Never   Smokeless tobacco: Never  Vaping Use   Vaping status: Never Used  Substance and Sexual Activity   Alcohol use: No   Drug use: No   Sexual activity: Not on file  Other Topics Concern   Not on file  Social History Narrative   Pt lives alone    Retired    Social Drivers of Corporate Investment Banker Strain: Not on file  Food Insecurity: Not on file  Transportation Needs: Not on file  Physical Activity: Not on file  Stress: Not on file  Social Connections: Not on file  Intimate Partner Violence: Not on file     PHYSICAL EXAM  Vitals:   11/17/24 0749 11/17/24 0754  BP: (!) 191/109 (!) 196/80  Pulse:  (!) 112  Resp:  16  Weight:  137 lb 8 oz (62.4 kg)  Height:  5' 2.5 (1.588 m)    Body mass index is 24.75 kg/m.    General: The patient is well-developed and well-nourished and in no acute distress  HEENT:  Head is [place]/AT.  Sclera are anicteric.  Funduscopic examination difficult due to small pupils (on drops).  I did not get good view of disc.  Neck: No carotid bruits are noted.  The neck is nontender.  Cardiovascular: The heart has a regular rate and rhythm with a normal S1 and S2. There were no murmurs, gallops or rubs.    Skin: Extremities are without rash or  edema.  Musculoskeletal:  Back is nontender  Neurologic Exam  Mental status: The patient is alert and oriented x 3 at the  time of the examination.  She had reduced short-term memory and reduced visual-spatial and focus/attention.  Montreal cognitive assessment was 18/30, 1 point lower than  earlier this year speech is normal.  Cranial nerves: Extraocular movements are full.  Color vision was symmetric.  Facial symmetry is present. There is good facial sensation to soft touch bilaterally.Facial strength is normal.  Trapezius and sternocleidomastoid strength is normal. No dysarthria is noted.  The tongue is midline, and the patient has symmetric elevation of the soft palate. No obvious hearing deficits are noted.  Motor:  Muscle bulk is normal.   Tone is normal. Strength is  5 / 5 in all 4 extremities.   Sensory: Sensory testing is intact to pinprick, soft touch and vibration sensation in all 4 extremities.  Coordination: Cerebellar testing reveals good finger-nose-finger and heel-to-shin bilaterally.  Gait and station: Station is normal.   Gait is normal. Tandem gait is normal for age. Romberg is negative.   Reflexes: Deep tendon reflexes are symmetric and normal bilaterally.      DIAGNOSTIC DATA (LABS, IMAGING, TESTING) - I reviewed patient records, labs, notes, testing and imaging myself where available.  Lab Results  Component Value Date   WBC 7.8 02/19/2022   HGB 14.0 02/19/2022   HCT 43.7 02/19/2022   MCV 87.2 02/19/2022   PLT 294 02/19/2022      Component Value Date/Time   NA 139 02/19/2022 1721   K 3.6 02/19/2022 1721   CL 102 02/19/2022 1721   CO2 25 02/19/2022 1721   GLUCOSE 94 02/19/2022 1721   BUN 12 02/19/2022 1721   CREATININE 0.79 02/19/2022 1721   CALCIUM 10.2 02/19/2022 1721   PROT 8.7 (H) 12/13/2014 0950   ALBUMIN 4.1 12/13/2014 0950   AST 22 12/13/2014 0950   ALT 18 12/13/2014 0950   ALKPHOS 66 12/13/2014 0950   BILITOT 0.5 12/13/2014 0950   GFRNONAA >60 02/19/2022 1721   GFRAA >60 02/01/2016 0830      ASSESSMENT AND PLAN  Alzheimer disease (HCC)  Amnestic mild cognitive disorder  Family history of Alzheimer disease   She has AD pathology with reduced AB 42/40 ratio though [id number] and NfL were normal.   We have discussed the  antiamyloid monoclonal antibodies though she is she prefers not to be treated with these at this time.  I did give her the phone number of a couple places in town that may be doing Alzheimer disease drug studies  At this point she prefers not to go on donepezil  and will reconsider if she feels memory worsens.  COntinue to see ophthalmology for eye issues.   Rtc 6 months  This visit is part of a comprehensive longitudinal care medical relationship regarding the patients primary diagnosis of amnestic MCI/Alzheimer's disease and related concerns.   Karsyn Jamie A. Vear, MD, [facility] 11/17/2024, 9:02 AM Certified in Neurology, Clinical Neurophysiology, Sleep Medicine and Neuroimaging  [facility] [address] [place], [place] [address] [phone]

## 2025-01-19 NOTE — Progress Notes (Signed)
 Maria [name]                                          MRN: [id number]   01/19/2025   The [facility] Quality Team Specialist reviewed this patient medical record for the purposes of chart review for care gap closure. The following were reviewed: chart review for care gap closure-controlling blood pressure.    [facility] Quality Team
